# Patient Record
Sex: Female | Born: 1975 | Race: White | Hispanic: No | Marital: Single | Smoking: Former smoker
Health system: Southern US, Community
[De-identification: ages and names within clinical notes are randomized; demographics above are authoritative.]

## PROBLEM LIST (undated history)

## (undated) DIAGNOSIS — K561 Intussusception: Secondary | ICD-10-CM

## (undated) DIAGNOSIS — C801 Malignant (primary) neoplasm, unspecified: Secondary | ICD-10-CM

## (undated) DIAGNOSIS — K589 Irritable bowel syndrome without diarrhea: Secondary | ICD-10-CM

## (undated) DIAGNOSIS — J45909 Unspecified asthma, uncomplicated: Secondary | ICD-10-CM

## (undated) DIAGNOSIS — N809 Endometriosis, unspecified: Secondary | ICD-10-CM

## (undated) HISTORY — PX: TONSILLECTOMY: SUR1361

## (undated) HISTORY — PX: CARPAL TUNNEL RELEASE: SHX101

## (undated) HISTORY — PX: ABDOMINAL SURGERY: SHX537

---

## 2011-11-18 ENCOUNTER — Emergency Department (HOSPITAL_BASED_OUTPATIENT_CLINIC_OR_DEPARTMENT_OTHER)
Admission: EM | Admit: 2011-11-18 | Discharge: 2011-11-18 | Disposition: A | Discharge status: Home / Self Care | Payer: Self-pay | Attending: Emergency Medicine | Admitting: Emergency Medicine

## 2011-11-18 ENCOUNTER — Encounter (HOSPITAL_BASED_OUTPATIENT_CLINIC_OR_DEPARTMENT_OTHER): Payer: Self-pay | Admitting: Emergency Medicine

## 2011-11-18 DIAGNOSIS — R109 Unspecified abdominal pain: Secondary | ICD-10-CM | POA: Insufficient documentation

## 2011-11-18 DIAGNOSIS — F17200 Nicotine dependence, unspecified, uncomplicated: Secondary | ICD-10-CM | POA: Insufficient documentation

## 2011-11-18 DIAGNOSIS — K589 Irritable bowel syndrome without diarrhea: Secondary | ICD-10-CM | POA: Insufficient documentation

## 2011-11-18 DIAGNOSIS — Z859 Personal history of malignant neoplasm, unspecified: Secondary | ICD-10-CM | POA: Insufficient documentation

## 2011-11-18 HISTORY — DX: Irritable bowel syndrome, unspecified: K58.9

## 2011-11-18 HISTORY — DX: Malignant (primary) neoplasm, unspecified: C80.1

## 2011-11-18 LAB — CBC WITH DIFFERENTIAL/PLATELET
Basophils Relative: 0 % (ref 0–1)
Eosinophils Absolute: 0.1 10*3/uL (ref 0.0–0.7)
HCT: 37.1 % (ref 36.0–46.0)
Hemoglobin: 13.1 g/dL (ref 12.0–15.0)
Lymphs Abs: 3.2 10*3/uL (ref 0.7–4.0)
MCH: 29.3 pg (ref 26.0–34.0)
MCHC: 35.3 g/dL (ref 30.0–36.0)
MCV: 83 fL (ref 78.0–100.0)
Monocytes Absolute: 0.5 10*3/uL (ref 0.1–1.0)
Monocytes Relative: 5 % (ref 3–12)
RBC: 4.47 MIL/uL (ref 3.87–5.11)

## 2011-11-18 LAB — COMPREHENSIVE METABOLIC PANEL
Albumin: 4.5 g/dL (ref 3.5–5.2)
Alkaline Phosphatase: 82 U/L (ref 39–117)
BUN: 10 mg/dL (ref 6–23)
Creatinine, Ser: 0.7 mg/dL (ref 0.50–1.10)
GFR calc Af Amer: >90 mL/min (ref >90)
Glucose, Bld: 83 mg/dL (ref 70–99)
Potassium: 3.5 mEq/L (ref 3.5–5.1)
Total Bilirubin: 0.2 mg/dL — ABNORMAL LOW (ref 0.3–1.2)
Total Protein: 7.6 g/dL (ref 6.0–8.3)

## 2011-11-18 LAB — URINALYSIS, ROUTINE W REFLEX MICROSCOPIC
Glucose, UA: NEGATIVE mg/dL
Ketones, ur: NEGATIVE mg/dL
Leukocytes, UA: NEGATIVE
Nitrite: NEGATIVE
Protein, ur: NEGATIVE mg/dL
pH: 5.5 (ref 5.0–8.0)

## 2011-11-18 MED ORDER — SODIUM CHLORIDE 0.9 % IV BOLUS (SEPSIS)
1000.0000 mL | Freq: Once | INTRAVENOUS | Status: AC
  Administered 2011-11-18: 1000 mL via INTRAVENOUS

## 2011-11-18 NOTE — ED Notes (Signed)
Report right sided flank pain.  On Monday started running a fever but thought it was a virus. Dipped urine at work and it showed gross blood.

## 2011-11-18 NOTE — ED Notes (Signed)
Pt c/o of nausea but no vomiting. Denies difficulty or pain with urination.

## 2011-11-18 NOTE — ED Provider Notes (Signed)
History     CSN: [id number]  Arrival date & time 11/18/11  1528   First MD Initiated Contact with Patient 11/18/11 1553      Chief Complaint  Patient presents with  . Flank Pain     HPI  The patient presents with right flank pain.  Symptoms began insidiously 40 ago.  Since onset symptoms have been persistent, mildly uncomfortable with pressure sensation.  The sensation is nonradiating.  Given the onset of symptoms the patient also had a fever, this improved with OTC medication.  Today she denies any fever, nausea, vomiting, anorexia other abdominal pain.  She works at a health care facility, had a urine dipstick performed.  This demonstrated gross hematuria, and she was referred here for further evaluation. Patient notes that her last menstrual cycle was one week ago.   Past Medical History  Diagnosis Date  . Cancer   . IBS (irritable bowel syndrome)     Past Surgical History  Procedure Date  . Carpal tunnel release     No family history on file.  History  Substance Use Topics  . Smoking status: Current Every Day Smoker  . Smokeless tobacco: Not on file  . Alcohol Use: No    OB History    Grav Para Term Preterm Abortions TAB SAB Ect Mult Living                  Review of Systems  Constitutional:       HPI  HENT:       HPI otherwise negative  Eyes: Negative.   Respiratory:       HPI, otherwise negative  Cardiovascular:       HPI, otherwise nmegative  Gastrointestinal: Negative for vomiting.  Genitourinary:       HPI, otherwise negative  Musculoskeletal:       HPI, otherwise negative  Skin: Negative.   Neurological: Negative for weakness.    Allergies  Benadryl and Versed  Home Medications  No current outpatient prescriptions on file.  BP 131/78  Pulse 78  Temp 98.2 F (36.8 C) (Oral)  Resp 16  Ht 5\' 3"  (1.6 m)  Wt 155 lb (70.308 kg)  BMI 27.46 kg/m2  SpO2 100%  LMP 11/11/2011  Physical Exam  Nursing note and vitals  reviewed. Constitutional: She is oriented to person, place, and time. She appears well-developed and well-nourished. No distress.  HENT:  Head: Normocephalic and atraumatic.  Eyes: Conjunctivae normal and EOM are normal.  Cardiovascular: Normal rate and regular rhythm.   Pulmonary/Chest: Effort normal and breath sounds normal. No stridor. No respiratory distress.  Abdominal: She exhibits no distension.  Musculoskeletal: She exhibits no edema.  Neurological: She is alert and oriented to person, place, and time. No cranial nerve deficit.  Skin: Skin is warm and dry.  Psychiatric: She has a normal mood and affect.    ED Course  Procedures (including critical care time)  Labs Reviewed  COMPREHENSIVE METABOLIC PANEL - Abnormal; Notable for the following:    Total Bilirubin 0.2 (*)     All other components within normal limits  CBC WITH DIFFERENTIAL  URINALYSIS, ROUTINE W REFLEX MICROSCOPIC  URINE CULTURE   No results found.   1. Flank pain       MDM  This generally well-appearing female now presents with right flank pain.  On my exam she is in no distress.  Given the patient's description of a lateral pain with possible hematuria there is some suspicion of  nephrolithiasis, the patient has no history of this, and was in no distress on my exam.  The patient's labs were unremarkable.  A urine culture was sent.  Absent abnormal labs, with stable vital signs, and with no distress, the patient is appropriate for discharge with close outpatient followup.  She was advised on the need to return for any concerning changes, and to follow up with her primary care physician.    Gerhard Munch, MD 11/18/11 1718

## 2011-11-19 LAB — URINE CULTURE

## 2015-02-10 ENCOUNTER — Emergency Department (HOSPITAL_COMMUNITY): Payer: 59

## 2015-02-10 ENCOUNTER — Emergency Department (HOSPITAL_COMMUNITY)
Admission: EM | Admit: 2015-02-10 | Discharge: 2015-02-10 | Disposition: A | Discharge status: Home / Self Care | Payer: 59 | Attending: Emergency Medicine | Admitting: Emergency Medicine

## 2015-02-10 ENCOUNTER — Encounter (HOSPITAL_COMMUNITY): Payer: Self-pay | Admitting: Emergency Medicine

## 2015-02-10 DIAGNOSIS — Z3202 Encounter for pregnancy test, result negative: Secondary | ICD-10-CM | POA: Insufficient documentation

## 2015-02-10 DIAGNOSIS — Z8719 Personal history of other diseases of the digestive system: Secondary | ICD-10-CM | POA: Diagnosis not present

## 2015-02-10 DIAGNOSIS — Z8541 Personal history of malignant neoplasm of cervix uteri: Secondary | ICD-10-CM | POA: Diagnosis not present

## 2015-02-10 DIAGNOSIS — Z79899 Other long term (current) drug therapy: Secondary | ICD-10-CM | POA: Diagnosis not present

## 2015-02-10 DIAGNOSIS — J45909 Unspecified asthma, uncomplicated: Secondary | ICD-10-CM | POA: Diagnosis not present

## 2015-02-10 DIAGNOSIS — F17200 Nicotine dependence, unspecified, uncomplicated: Secondary | ICD-10-CM | POA: Diagnosis not present

## 2015-02-10 DIAGNOSIS — R1031 Right lower quadrant pain: Secondary | ICD-10-CM | POA: Insufficient documentation

## 2015-02-10 DIAGNOSIS — Z8742 Personal history of other diseases of the female genital tract: Secondary | ICD-10-CM | POA: Insufficient documentation

## 2015-02-10 HISTORY — DX: Endometriosis, unspecified: N80.9

## 2015-02-10 HISTORY — DX: Intussusception: K56.1

## 2015-02-10 HISTORY — DX: Unspecified asthma, uncomplicated: J45.909

## 2015-02-10 LAB — COMPREHENSIVE METABOLIC PANEL
ALBUMIN: 4.3 g/dL (ref 3.5–5.0)
ALK PHOS: 79 U/L (ref 38–126)
ALT: 31 U/L (ref 14–54)
ANION GAP: 11 (ref 5–15)
AST: 22 U/L (ref 15–41)
BILIRUBIN TOTAL: 0.4 mg/dL (ref 0.3–1.2)
BUN: 17 mg/dL (ref 6–20)
CALCIUM: 9 mg/dL (ref 8.9–10.3)
CO2: 23 mmol/L (ref 22–32)
Chloride: 104 mmol/L (ref 101–111)
Creatinine, Ser: 0.72 mg/dL (ref 0.44–1.00)
GFR calc non Af Amer: >60 mL/min (ref >60)
Glucose, Bld: 133 mg/dL — ABNORMAL HIGH (ref 65–99)
POTASSIUM: 3.9 mmol/L (ref 3.5–5.1)
SODIUM: 138 mmol/L (ref 135–145)
TOTAL PROTEIN: 7.4 g/dL (ref 6.5–8.1)

## 2015-02-10 LAB — CBC WITH DIFFERENTIAL/PLATELET
BASOS PCT: 0 %
Basophils Absolute: 0 10*3/uL (ref 0.0–0.1)
EOS ABS: 0.2 10*3/uL (ref 0.0–0.7)
Eosinophils Relative: 2 %
HEMATOCRIT: 40.2 % (ref 36.0–46.0)
HEMOGLOBIN: 13.4 g/dL (ref 12.0–15.0)
LYMPHS ABS: 3.7 10*3/uL (ref 0.7–4.0)
Lymphocytes Relative: 44 %
MCH: 28.8 pg (ref 26.0–34.0)
MCHC: 33.3 g/dL (ref 30.0–36.0)
MCV: 86.5 fL (ref 78.0–100.0)
MONO ABS: 0.5 10*3/uL (ref 0.1–1.0)
MONOS PCT: 6 %
NEUTROS PCT: 48 %
Neutro Abs: 4.1 10*3/uL (ref 1.7–7.7)
Platelets: 250 10*3/uL (ref 150–400)
RBC: 4.65 MIL/uL (ref 3.87–5.11)
RDW: 13.2 % (ref 11.5–15.5)
WBC: 8.5 10*3/uL (ref 4.0–10.5)

## 2015-02-10 LAB — URINALYSIS, ROUTINE W REFLEX MICROSCOPIC
Bilirubin Urine: NEGATIVE
Glucose, UA: NEGATIVE mg/dL
Hgb urine dipstick: NEGATIVE
KETONES UR: NEGATIVE mg/dL
LEUKOCYTES UA: NEGATIVE
NITRITE: NEGATIVE
PROTEIN: NEGATIVE mg/dL
Specific Gravity, Urine: 1.037 — ABNORMAL HIGH (ref 1.005–1.030)
pH: 5.5 (ref 5.0–8.0)

## 2015-02-10 LAB — PREGNANCY, URINE: PREG TEST UR: NEGATIVE

## 2015-02-10 MED ORDER — IOHEXOL 300 MG/ML  SOLN
25.0000 mL | Freq: Once | INTRAMUSCULAR | Status: AC | PRN
  Administered 2015-02-10: 25 mL via ORAL

## 2015-02-10 MED ORDER — IOHEXOL 300 MG/ML  SOLN
100.0000 mL | Freq: Once | INTRAMUSCULAR | Status: AC | PRN
  Administered 2015-02-10: 100 mL via INTRAVENOUS

## 2015-02-10 NOTE — Discharge Instructions (Signed)

## 2015-02-10 NOTE — ED Provider Notes (Signed)
Pt care assumed from [facility], PA-C at shift change pending CT results. In short, pt presenting with 2 days of intermittent RLQ abdominal pain. Pain is exacerbated with movement and palpation. Percocet provided some relief. No associated fever, chills, nausea, vomiting, diarrhea, vaginal discharge or dysuria. Pt with hx of IBS. On exam, tenderness in RLQ with mild voluntary guarding. No rigidity or rebound. Lab work and UA unremarkable. Patient's pain and other symptoms adequately managed in emergency department. Fluid bolus given   CT abd/pelv negative for acute disease processes. Chronic abnormal findings noted in RLQ when compared to CT in 2014.   Repeat abdominal exam before discharge without change. Patient discharged home with symptomatic treatment and given strict instructions for follow-up with their primary care physician.  I have also discussed reasons to return immediately to the ER.  Patient expresses understanding and agrees with plan.   Filed Vitals:   02/10/15 0356 02/10/15 0632 02/10/15 0633  BP: 139/88 121/53 113/70  Pulse: 72 73   Temp: 97.6 F (36.4 C) 97.5 F (36.4 C)   TempSrc: Oral Oral   Resp: 14 12   SpO2: 99% 98%      Results for orders placed or performed during the hospital encounter of 02/10/15 (from the past 24 hour(s))  CBC with Differential     Status: None   Collection Time: 02/10/15  4:04 AM  Result Value Ref Range   WBC 8.5 4.0 - 10.5 K/uL   RBC 4.65 3.87 - 5.11 MIL/uL   Hemoglobin 13.4 12.0 - 15.0 g/dL   HCT 40.2 36.0 - 46.0 %   MCV 86.5 78.0 - 100.0 fL   MCH 28.8 26.0 - 34.0 pg   MCHC 33.3 30.0 - 36.0 g/dL   RDW 13.2 11.5 - 15.5 %   Platelets 250 150 - 400 K/uL   Neutrophils Relative % 48 %   Neutro Abs 4.1 1.7 - 7.7 K/uL   Lymphocytes Relative 44 %   Lymphs Abs 3.7 0.7 - 4.0 K/uL   Monocytes Relative 6 %   Monocytes Absolute 0.5 0.1 - 1.0 K/uL   Eosinophils Relative 2 %   Eosinophils Absolute 0.2 0.0 - 0.7 K/uL   Basophils Relative 0 %    Basophils Absolute 0.0 0.0 - 0.1 K/uL  Comprehensive metabolic panel     Status: Abnormal   Collection Time: 02/10/15  4:04 AM  Result Value Ref Range   Sodium 138 135 - 145 mmol/L   Potassium 3.9 3.5 - 5.1 mmol/L   Chloride 104 101 - 111 mmol/L   CO2 23 22 - 32 mmol/L   Glucose, Bld 133 (H) 65 - 99 mg/dL   BUN 17 6 - 20 mg/dL   Creatinine, Ser 0.72 0.44 - 1.00 mg/dL   Calcium 9.0 8.9 - 10.3 mg/dL   Total Protein 7.4 6.5 - 8.1 g/dL   Albumin 4.3 3.5 - 5.0 g/dL   AST 22 15 - 41 U/L   ALT 31 14 - 54 U/L   Alkaline Phosphatase 79 38 - 126 U/L   Total Bilirubin 0.4 0.3 - 1.2 mg/dL   GFR calc non Af Amer >60 >60 mL/min   GFR calc Af Amer >60 >60 mL/min   Anion gap 11 5 - 15  Urinalysis, Routine w reflex microscopic (not at [facility])     Status: Abnormal   Collection Time: 02/10/15  4:25 AM  Result Value Ref Range   Color, Urine YELLOW YELLOW   APPearance CLOUDY (A) CLEAR  Specific Gravity, Urine 1.037 (H) 1.005 - 1.030   pH 5.5 5.0 - 8.0   Glucose, UA NEGATIVE NEGATIVE mg/dL   Hgb urine dipstick NEGATIVE NEGATIVE   Bilirubin Urine NEGATIVE NEGATIVE   Ketones, ur NEGATIVE NEGATIVE mg/dL   Protein, ur NEGATIVE NEGATIVE mg/dL   Nitrite NEGATIVE NEGATIVE   Leukocytes, UA NEGATIVE NEGATIVE  Pregnancy, urine     Status: None   Collection Time: 02/10/15  4:25 AM  Result Value Ref Range   Preg Test, Ur NEGATIVE NEGATIVE   CLINICAL DATA: Right lower quadrant pain over the last 8 hours, worsening. Normal white count.  EXAM: CT ABDOMEN AND PELVIS WITH CONTRAST  TECHNIQUE: Multidetector CT imaging of the abdomen and pelvis was performed using the standard protocol following bolus administration of intravenous contrast.  CONTRAST: 147mL OMNIPAQUE IOHEXOL 300 MG/ML SOLN  COMPARISON: CT 03/27/2012  FINDINGS: Lung bases are clear. No pleural or pericardial fluid. The liver shows mild fatty change. No focal lesion or detectable biliary ductal dilatation. The spleen is normal.  The pancreas is normal. The adrenal glands are normal. The kidneys are normal. The aorta and IVC are normal. No retroperitoneal mass or retroperitoneal lymphadenopathy. The uterus and adnexal regions are normal. The bladder appears normal.  There is no evidence of ileus or obstruction. No abnormality of the colon other than diverticulosis of the left colon. The appendix appears normal. The terminal ileum appears abnormal showing an abnormal dilated laminae with irregularity of the mucosa. This could be related to Crohn's disease as questioned previously. Lymph nodes in the right lower quadrant are actually less prominent than seen previously the index node measuring only 8 mm on today's study.  No significant bone finding.  IMPRESSION: No evidence of ileus, obstruction or free air. No appendicitis.  Abnormal findings again demonstrated in the right lower quadrant. Slightly prominent right lower quadrant mesenteric lymph nodes, though less than on the previous study. Continued abnormal appearance of the terminal ileum, which shows an irregular mucosal pattern with slight dilatation. This could go along with the diagnosis of Crohn's disease. No evidence of active local Complication.   Lahoma Crocker Kendell Sagraves, PA-C 02/10/15 [place], MD 02/11/15 [phone]

## 2015-02-10 NOTE — ED Notes (Signed)
Pt c/o RLQ pain onset 12/18 @ 2100. Worse with movement, denies N/V/D. Denies gyn s/s, denies urinary s/s

## 2015-02-10 NOTE — ED Provider Notes (Signed)
CSN: [id number]     Arrival date & time 02/10/15  0346 History   First MD Initiated Contact with Patient 02/10/15 0350     Chief Complaint  Patient presents with  . Abdominal Pain     (Consider location/radiation/quality/duration/timing/severity/associated sxs/prior Treatment) HPI Comments: 39 year old female presents to the emergency department for evaluation of abdominal pain. Symptoms began 2 days ago in the right lower quadrant. Pain has been waxing and waning in severity and intermittent, however, has been fairly constant over the past 24 hours. Pain worsens with movement. It is nonradiating. Patient took Percocet for symptoms this morning with some relief. She denies any nausea, vomiting, diarrhea, melena or hematochezia, vaginal bleeding, vaginal discharge, dysuria, or hematuria. No fever. Patient has a history of laparoscopic surgery 3 for endometriosis. She also had her gallbladder removed one year ago. Patient also with history of intussusception as well as IBS.  PCP - Dr. Jacqlyn Larsen  Patient is a 39 y.o. female presenting with abdominal pain. The history is provided by the patient. No language interpreter was used.  Abdominal Pain Associated symptoms: no dysuria, no fever, no hematuria, no nausea, no vaginal bleeding, no vaginal discharge and no vomiting     Past Medical History  Diagnosis Date  . IBS (irritable bowel syndrome)   . Cancer (HCC)     cervical   . Asthma   . Intussusception ([place])   . Endometriosis    Past Surgical History  Procedure Laterality Date  . Carpal tunnel release    . Tonsillectomy    . Abdominal surgery     No family history on file. Social History  Substance Use Topics  . Smoking status: Current Every Day Smoker  . Smokeless tobacco: None  . Alcohol Use: Yes     Comment: occ   OB History    No data available      Review of Systems  Constitutional: Negative for fever.  Gastrointestinal: Positive for abdominal pain. Negative for nausea  and vomiting.  Genitourinary: Negative for dysuria, hematuria, vaginal bleeding and vaginal discharge.  All other systems reviewed and are negative.   Allergies  Ambien; Benadryl; and Versed  Home Medications   Prior to Admission medications   Medication Sig Start Date End Date Taking? Authorizing Provider  albuterol (PROVENTIL HFA;VENTOLIN HFA) 108 (90 BASE) MCG/ACT inhaler Inhale 1-2 puffs into the lungs every 6 (six) hours as needed for wheezing or shortness of breath.   Yes Historical Provider, MD  JOLIVETTE 0.35 MG tablet Take 1 tablet by mouth daily. 01/09/15  Yes Historical Provider, MD   BP 113/70 mmHg  Pulse 73  Temp(Src) 97.5 F (36.4 C) (Oral)  Resp 12  SpO2 98%  LMP 02/02/2015   Physical Exam  Constitutional: She is oriented to person, place, and time. She appears well-developed and well-nourished. No distress.  Nontoxic/nonseptic appearing  HENT:  Head: Normocephalic and atraumatic.  Eyes: Conjunctivae and EOM are normal. No scleral icterus.  Neck: Normal range of motion.  Cardiovascular: Normal rate, regular rhythm and intact distal pulses.   Pulmonary/Chest: Effort normal. No respiratory distress.  Respirations even and unlabored  Abdominal: Soft. She exhibits no distension. There is tenderness. There is no rebound and no guarding.    Soft abdomen with focal TTP in the RLQ. Mild voluntary guarding. No peritoneal signs.  Musculoskeletal: Normal range of motion.  Neurological: She is alert and oriented to person, place, and time. She exhibits normal muscle tone. Coordination normal.  Skin: Skin is warm and  dry. No rash noted. She is not diaphoretic. No erythema. No pallor.  Psychiatric: She has a normal mood and affect. Her behavior is normal.  Nursing note and vitals reviewed.   ED Course  Procedures (including critical care time) Labs Review Labs Reviewed  COMPREHENSIVE METABOLIC PANEL - Abnormal; Notable for the following:    Glucose, Bld 133 (*)     All other components within normal limits  URINALYSIS, ROUTINE W REFLEX MICROSCOPIC (NOT AT [facility]) - Abnormal; Notable for the following:    APPearance CLOUDY (*)    Specific Gravity, Urine 1.037 (*)    All other components within normal limits  CBC WITH DIFFERENTIAL/PLATELET  PREGNANCY, URINE    Imaging Review No results found.   I have personally reviewed and evaluated these images and lab results as part of my medical decision-making.   EKG Interpretation None      MDM   Final diagnoses:  Right lower quadrant abdominal pain    39 year old female presents to the emergency department for evaluation of 2 days of right lower quadrant abdominal pain. She has no peritoneal signs on exam, but tenderness is appreciated at McBurney's point. Laboratory workup is reassuring. Patient has had no nausea, vomiting, or fever. Patient pending CT scan to evaluate for appendicitis. Patient signed out to [facility], PA-C at change of shift who will follow-up on imaging and disposition appropriately. Patient has declined pain medication while in the emergency department.   Filed Vitals:   02/10/15 0356 02/10/15 0632 02/10/15 0633  BP: 139/88 121/53 113/70  Pulse: 72 73   Temp: 97.6 F (36.4 C) 97.5 F (36.4 C)   TempSrc: Oral Oral   Resp: 14 12   SpO2: 99% 98%      Antonietta Breach, PA-C 02/11/15 [place], MD 02/11/15 [phone]

## 2015-02-10 NOTE — ED Notes (Signed)
Bed: WA13 Expected date:  Expected time:  Means of arrival:  Comments: 

## 2015-03-24 DIAGNOSIS — N63 Unspecified lump in breast: Secondary | ICD-10-CM | POA: Diagnosis not present

## 2015-03-24 DIAGNOSIS — Z01411 Encounter for gynecological examination (general) (routine) with abnormal findings: Secondary | ICD-10-CM | POA: Diagnosis not present

## 2015-03-24 DIAGNOSIS — N921 Excessive and frequent menstruation with irregular cycle: Secondary | ICD-10-CM | POA: Diagnosis not present

## 2015-03-24 DIAGNOSIS — Z124 Encounter for screening for malignant neoplasm of cervix: Secondary | ICD-10-CM | POA: Diagnosis not present

## 2015-03-24 DIAGNOSIS — Z1151 Encounter for screening for human papillomavirus (HPV): Secondary | ICD-10-CM | POA: Diagnosis not present

## 2015-03-24 DIAGNOSIS — R87610 Atypical squamous cells of undetermined significance on cytologic smear of cervix (ASC-US): Secondary | ICD-10-CM | POA: Diagnosis not present

## 2015-03-24 DIAGNOSIS — Z6833 Body mass index (BMI) 33.0-33.9, adult: Secondary | ICD-10-CM | POA: Diagnosis not present

## 2015-03-24 DIAGNOSIS — Z01419 Encounter for gynecological examination (general) (routine) without abnormal findings: Secondary | ICD-10-CM | POA: Diagnosis not present

## 2015-03-26 DIAGNOSIS — R928 Other abnormal and inconclusive findings on diagnostic imaging of breast: Secondary | ICD-10-CM | POA: Diagnosis not present

## 2015-03-26 DIAGNOSIS — N6489 Other specified disorders of breast: Secondary | ICD-10-CM | POA: Diagnosis not present

## 2015-03-26 DIAGNOSIS — N63 Unspecified lump in breast: Secondary | ICD-10-CM | POA: Diagnosis not present

## 2015-04-08 DIAGNOSIS — N926 Irregular menstruation, unspecified: Secondary | ICD-10-CM | POA: Diagnosis not present

## 2015-04-08 DIAGNOSIS — N921 Excessive and frequent menstruation with irregular cycle: Secondary | ICD-10-CM | POA: Diagnosis not present

## 2015-04-08 DIAGNOSIS — Z6833 Body mass index (BMI) 33.0-33.9, adult: Secondary | ICD-10-CM | POA: Diagnosis not present

## 2015-05-19 DIAGNOSIS — Z6833 Body mass index (BMI) 33.0-33.9, adult: Secondary | ICD-10-CM | POA: Diagnosis not present

## 2015-05-19 DIAGNOSIS — N921 Excessive and frequent menstruation with irregular cycle: Secondary | ICD-10-CM | POA: Diagnosis not present

## 2015-05-19 DIAGNOSIS — N83202 Unspecified ovarian cyst, left side: Secondary | ICD-10-CM | POA: Diagnosis not present

## 2015-08-04 DIAGNOSIS — N840 Polyp of corpus uteri: Secondary | ICD-10-CM | POA: Diagnosis not present

## 2015-08-04 DIAGNOSIS — Z6833 Body mass index (BMI) 33.0-33.9, adult: Secondary | ICD-10-CM | POA: Diagnosis not present

## 2015-08-04 DIAGNOSIS — Z01812 Encounter for preprocedural laboratory examination: Secondary | ICD-10-CM | POA: Diagnosis not present

## 2015-08-14 DIAGNOSIS — R87612 Low grade squamous intraepithelial lesion on cytologic smear of cervix (LGSIL): Secondary | ICD-10-CM | POA: Diagnosis not present

## 2015-08-14 DIAGNOSIS — N840 Polyp of corpus uteri: Secondary | ICD-10-CM | POA: Diagnosis not present

## 2015-08-14 DIAGNOSIS — N841 Polyp of cervix uteri: Secondary | ICD-10-CM | POA: Diagnosis not present

## 2015-08-14 DIAGNOSIS — N920 Excessive and frequent menstruation with regular cycle: Secondary | ICD-10-CM | POA: Diagnosis not present

## 2015-08-19 DIAGNOSIS — H52223 Regular astigmatism, bilateral: Secondary | ICD-10-CM | POA: Diagnosis not present

## 2015-09-11 DIAGNOSIS — R87613 High grade squamous intraepithelial lesion on cytologic smear of cervix (HGSIL): Secondary | ICD-10-CM | POA: Diagnosis not present

## 2015-09-11 DIAGNOSIS — N72 Inflammatory disease of cervix uteri: Secondary | ICD-10-CM | POA: Diagnosis not present

## 2015-09-11 DIAGNOSIS — Z01812 Encounter for preprocedural laboratory examination: Secondary | ICD-10-CM | POA: Diagnosis not present

## 2015-09-11 DIAGNOSIS — Z3202 Encounter for pregnancy test, result negative: Secondary | ICD-10-CM | POA: Diagnosis not present

## 2015-11-05 DIAGNOSIS — R87613 High grade squamous intraepithelial lesion on cytologic smear of cervix (HGSIL): Secondary | ICD-10-CM | POA: Diagnosis not present

## 2015-11-25 DIAGNOSIS — R87613 High grade squamous intraepithelial lesion on cytologic smear of cervix (HGSIL): Secondary | ICD-10-CM | POA: Diagnosis not present

## 2015-11-26 DIAGNOSIS — Z0000 Encounter for general adult medical examination without abnormal findings: Secondary | ICD-10-CM | POA: Diagnosis not present

## 2015-12-01 DIAGNOSIS — Z885 Allergy status to narcotic agent status: Secondary | ICD-10-CM | POA: Diagnosis not present

## 2015-12-01 DIAGNOSIS — D060 Carcinoma in situ of endocervix: Secondary | ICD-10-CM | POA: Diagnosis not present

## 2015-12-01 DIAGNOSIS — D069 Carcinoma in situ of cervix, unspecified: Secondary | ICD-10-CM | POA: Diagnosis not present

## 2015-12-01 DIAGNOSIS — F17210 Nicotine dependence, cigarettes, uncomplicated: Secondary | ICD-10-CM | POA: Diagnosis not present

## 2015-12-01 DIAGNOSIS — N72 Inflammatory disease of cervix uteri: Secondary | ICD-10-CM | POA: Diagnosis not present

## 2015-12-02 DIAGNOSIS — N72 Inflammatory disease of cervix uteri: Secondary | ICD-10-CM | POA: Diagnosis not present

## 2015-12-02 DIAGNOSIS — D060 Carcinoma in situ of endocervix: Secondary | ICD-10-CM | POA: Diagnosis not present

## 2015-12-02 DIAGNOSIS — Z885 Allergy status to narcotic agent status: Secondary | ICD-10-CM | POA: Diagnosis not present

## 2015-12-02 DIAGNOSIS — F17210 Nicotine dependence, cigarettes, uncomplicated: Secondary | ICD-10-CM | POA: Diagnosis not present

## 2015-12-10 ENCOUNTER — Other Ambulatory Visit (HOSPITAL_COMMUNITY): Payer: Self-pay | Admitting: Internal Medicine

## 2015-12-10 DIAGNOSIS — C549 Malignant neoplasm of corpus uteri, unspecified: Secondary | ICD-10-CM

## 2015-12-10 DIAGNOSIS — Z23 Encounter for immunization: Secondary | ICD-10-CM | POA: Diagnosis not present

## 2015-12-10 DIAGNOSIS — R740 Nonspecific elevation of levels of transaminase and lactic acid dehydrogenase [LDH]: Secondary | ICD-10-CM

## 2015-12-10 DIAGNOSIS — E6609 Other obesity due to excess calories: Secondary | ICD-10-CM | POA: Diagnosis not present

## 2015-12-10 DIAGNOSIS — C539 Malignant neoplasm of cervix uteri, unspecified: Secondary | ICD-10-CM | POA: Diagnosis not present

## 2015-12-10 DIAGNOSIS — Z0000 Encounter for general adult medical examination without abnormal findings: Secondary | ICD-10-CM | POA: Diagnosis not present

## 2015-12-10 DIAGNOSIS — R7401 Elevation of levels of liver transaminase levels: Secondary | ICD-10-CM

## 2015-12-12 ENCOUNTER — Ambulatory Visit (HOSPITAL_COMMUNITY)
Admission: RE | Admit: 2015-12-12 | Discharge: 2015-12-12 | Disposition: A | Discharge status: Home / Self Care | Payer: 59 | Source: Ambulatory Visit | Attending: Internal Medicine | Admitting: Internal Medicine

## 2015-12-12 DIAGNOSIS — R740 Nonspecific elevation of levels of transaminase and lactic acid dehydrogenase [LDH]: Secondary | ICD-10-CM | POA: Diagnosis not present

## 2015-12-12 DIAGNOSIS — C539 Malignant neoplasm of cervix uteri, unspecified: Secondary | ICD-10-CM | POA: Insufficient documentation

## 2015-12-12 DIAGNOSIS — C55 Malignant neoplasm of uterus, part unspecified: Secondary | ICD-10-CM | POA: Diagnosis not present

## 2015-12-12 DIAGNOSIS — K769 Liver disease, unspecified: Secondary | ICD-10-CM | POA: Diagnosis not present

## 2015-12-12 DIAGNOSIS — Z9049 Acquired absence of other specified parts of digestive tract: Secondary | ICD-10-CM | POA: Diagnosis not present

## 2015-12-12 DIAGNOSIS — C549 Malignant neoplasm of corpus uteri, unspecified: Secondary | ICD-10-CM

## 2015-12-12 DIAGNOSIS — R7401 Elevation of levels of liver transaminase levels: Secondary | ICD-10-CM

## 2016-01-05 DIAGNOSIS — R7989 Other specified abnormal findings of blood chemistry: Secondary | ICD-10-CM | POA: Diagnosis not present

## 2016-01-05 DIAGNOSIS — Z0000 Encounter for general adult medical examination without abnormal findings: Secondary | ICD-10-CM | POA: Diagnosis not present

## 2016-04-19 DIAGNOSIS — Z01419 Encounter for gynecological examination (general) (routine) without abnormal findings: Secondary | ICD-10-CM | POA: Diagnosis not present

## 2016-04-19 DIAGNOSIS — R87612 Low grade squamous intraepithelial lesion on cytologic smear of cervix (LGSIL): Secondary | ICD-10-CM | POA: Diagnosis not present

## 2016-04-19 DIAGNOSIS — Z01411 Encounter for gynecological examination (general) (routine) with abnormal findings: Secondary | ICD-10-CM | POA: Diagnosis not present

## 2016-04-19 DIAGNOSIS — Z1272 Encounter for screening for malignant neoplasm of vagina: Secondary | ICD-10-CM | POA: Diagnosis not present

## 2016-04-19 DIAGNOSIS — Z6833 Body mass index (BMI) 33.0-33.9, adult: Secondary | ICD-10-CM | POA: Diagnosis not present

## 2016-04-19 DIAGNOSIS — Z1151 Encounter for screening for human papillomavirus (HPV): Secondary | ICD-10-CM | POA: Diagnosis not present

## 2016-05-11 DIAGNOSIS — Z01812 Encounter for preprocedural laboratory examination: Secondary | ICD-10-CM | POA: Diagnosis not present

## 2016-05-11 DIAGNOSIS — R87612 Low grade squamous intraepithelial lesion on cytologic smear of cervix (LGSIL): Secondary | ICD-10-CM | POA: Diagnosis not present

## 2016-05-11 DIAGNOSIS — Z6833 Body mass index (BMI) 33.0-33.9, adult: Secondary | ICD-10-CM | POA: Diagnosis not present

## 2016-06-24 IMAGING — CT CT ABD-PELV W/ CM
2 of 4 series · 16 of 46 positions shown, 18 images · IV contrast (100 ML OMNI 300)
Comparison: CT 03/27/2012

CLINICAL DATA: Right lower quadrant pain over the last 8 hours,
worsening. Normal white count.

EXAM:
CT ABDOMEN AND PELVIS WITH CONTRAST
TECHNIQUE: Multidetector CT imaging of the abdomen and pelvis was performed
using the standard protocol following bolus administration of
intravenous contrast.
CONTRAST:  100mL OMNIPAQUE IOHEXOL 300 MG/ML  SOLN

[Series 2: abd/pel with · axial · 0.73mm/px · z∈[-243,+147]mm · 13 of 86 slices shown, 15 images]
[im 4/86  soft-tissue]
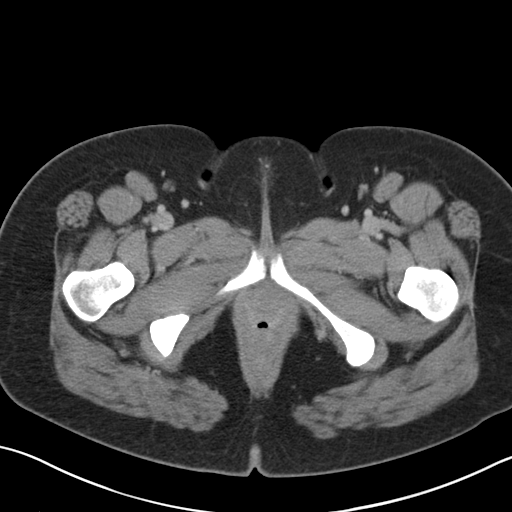
[im 4/86  bone]
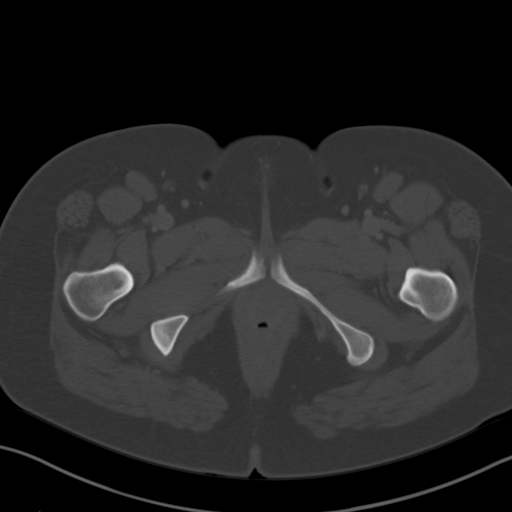
[im 11/86  soft-tissue]
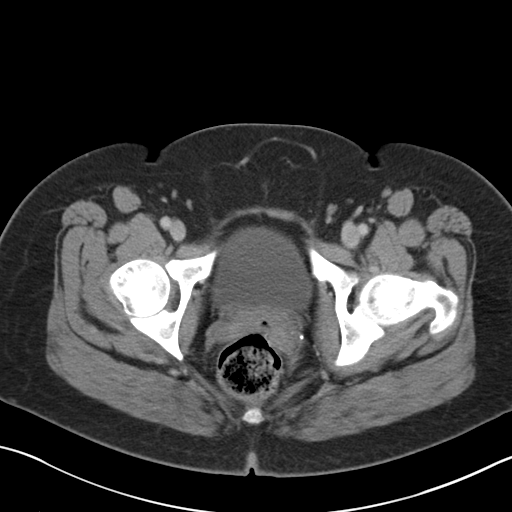
[im 18/86  soft-tissue]
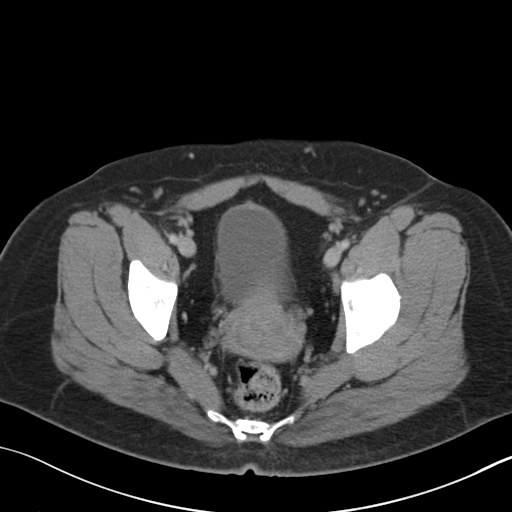
[im 24/86  soft-tissue]
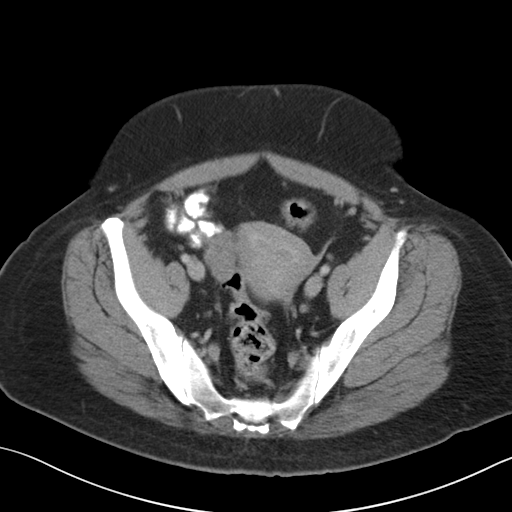
[im 31/86  soft-tissue]
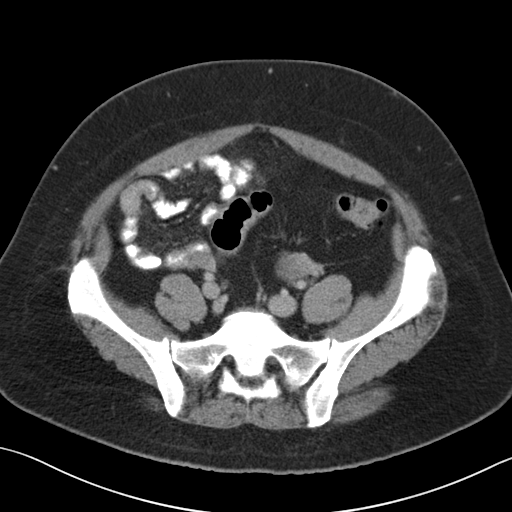
[im 38/86  soft-tissue]
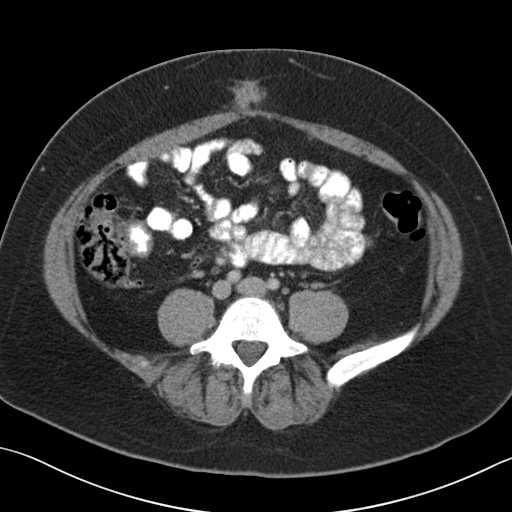
[im 45/86  soft-tissue]
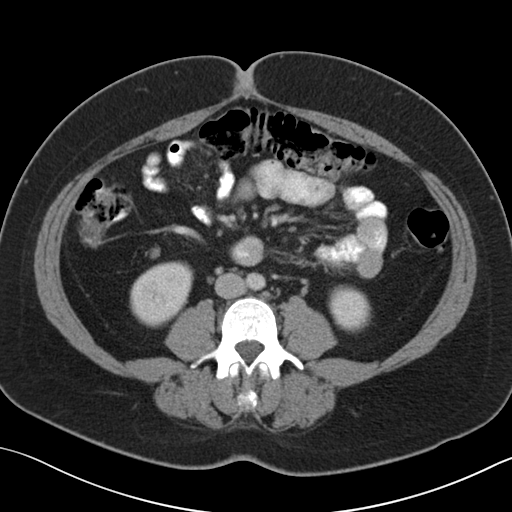
[im 48/86  soft-tissue]
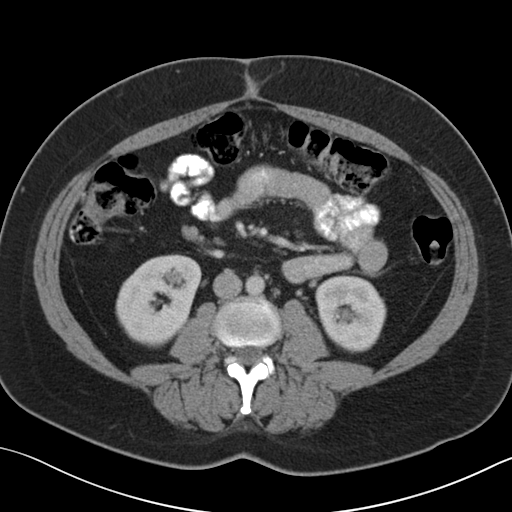
[im 55/86  soft-tissue]
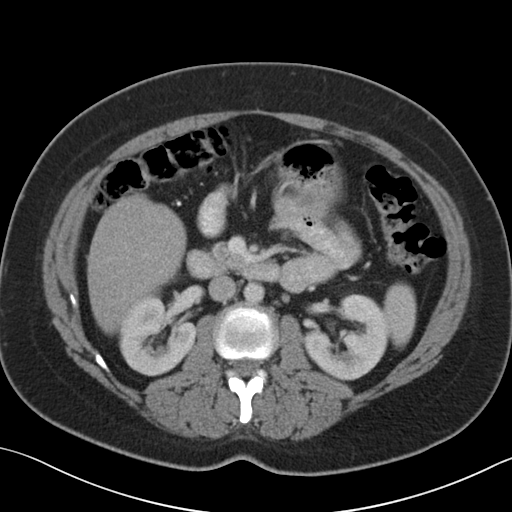
[im 55/86  bone]
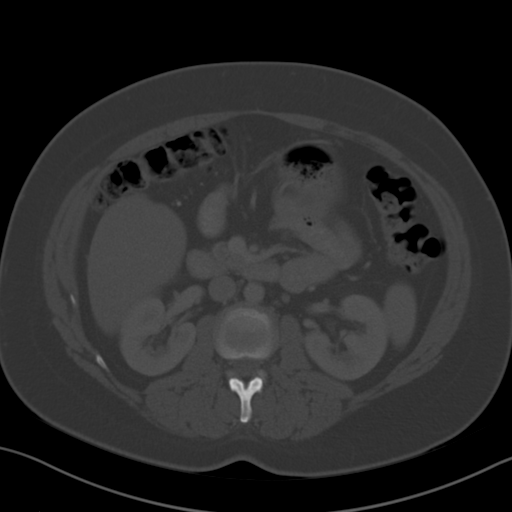
[im 62/86  soft-tissue]
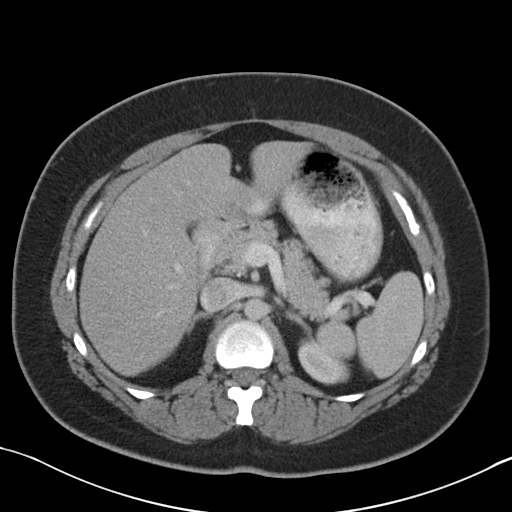
[im 69/86  soft-tissue]
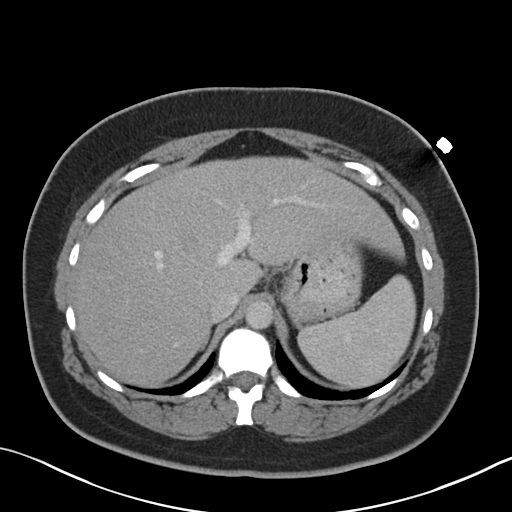
[im 75/86  soft-tissue]
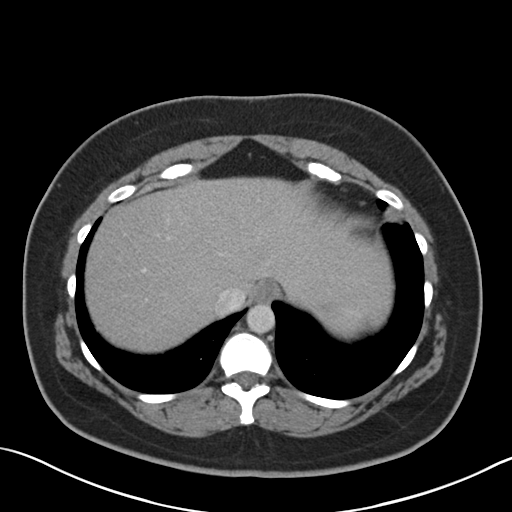
[im 82/86  soft-tissue]
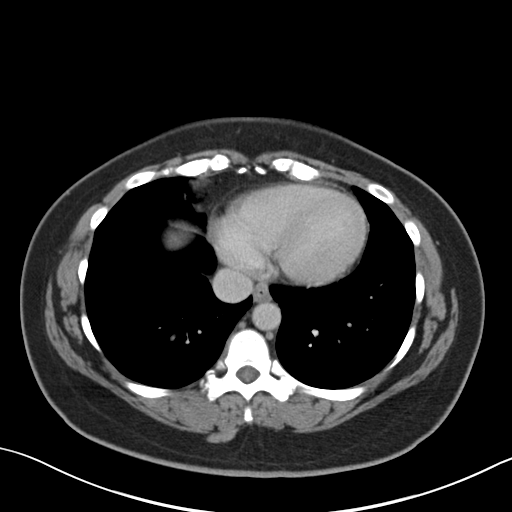

[Series 4: coronal a/|p · coronal · 0.70mm/px · 3 of 94 slices shown]
[im 32/94  soft-tissue]
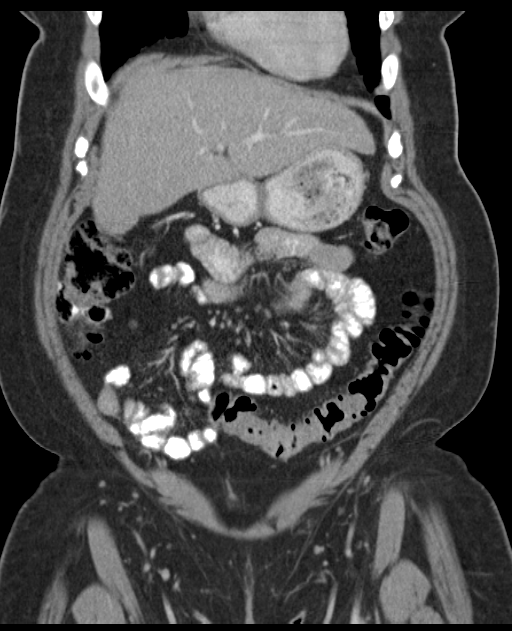
[im 42/94  soft-tissue]
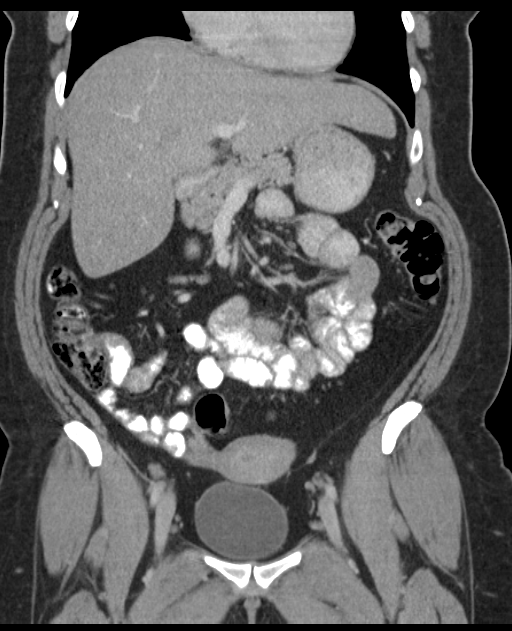
[im 52/94  soft-tissue]
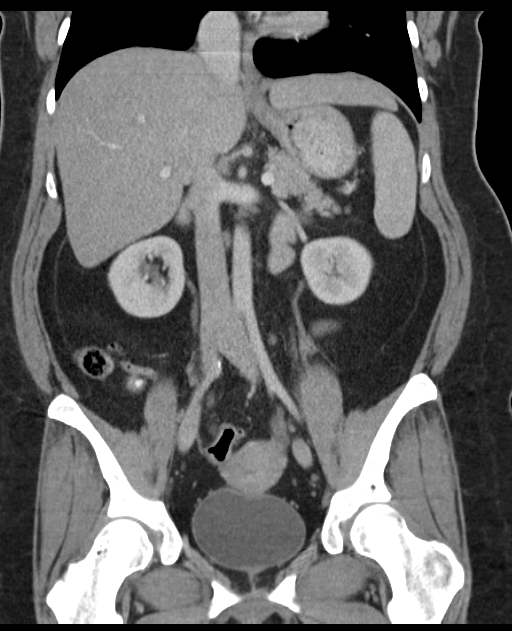

[16 of 46 positions shown; findings below may reference images not displayed]

FINDINGS: Lung bases are clear. No pleural or pericardial fluid. The liver
shows mild fatty change. No focal lesion or detectable biliary
ductal dilatation. The spleen is normal. The pancreas is normal. The
adrenal glands are normal. The kidneys are normal. The aorta and IVC
are normal. No retroperitoneal mass or retroperitoneal
lymphadenopathy. The uterus and adnexal regions are normal. The
bladder appears normal.

There is no evidence of ileus or obstruction. No abnormality of the
colon other than diverticulosis of the left colon. The appendix
appears normal. The terminal ileum appears abnormal showing an
abnormal dilated laminae with irregularity of the mucosa. This could
be related to Crohn's disease as questioned previously. Lymph nodes
in the right lower quadrant are actually less prominent than seen
previously the index node measuring only 8 mm on today's study.

No significant bone finding.
IMPRESSION: No evidence of ileus, obstruction or free air.  No appendicitis.

Abnormal findings again demonstrated in the right lower quadrant.
Slightly prominent right lower quadrant mesenteric lymph nodes,
though less than on the previous study. Continued abnormal
appearance of the terminal ileum, which shows an irregular mucosal
pattern with slight dilatation. This could go along with the
diagnosis of Crohn's disease. No evidence of active local
complication.

## 2016-06-30 DIAGNOSIS — R7989 Other specified abnormal findings of blood chemistry: Secondary | ICD-10-CM | POA: Diagnosis not present

## 2016-07-05 DIAGNOSIS — K7689 Other specified diseases of liver: Secondary | ICD-10-CM | POA: Diagnosis not present

## 2016-07-20 DIAGNOSIS — K1120 Sialoadenitis, unspecified: Secondary | ICD-10-CM | POA: Diagnosis not present

## 2016-07-20 DIAGNOSIS — K116 Mucocele of salivary gland: Secondary | ICD-10-CM | POA: Diagnosis not present

## 2016-10-04 DIAGNOSIS — K7689 Other specified diseases of liver: Secondary | ICD-10-CM | POA: Diagnosis not present

## 2016-10-11 DIAGNOSIS — Z0000 Encounter for general adult medical examination without abnormal findings: Secondary | ICD-10-CM | POA: Diagnosis not present

## 2016-10-11 DIAGNOSIS — E559 Vitamin D deficiency, unspecified: Secondary | ICD-10-CM | POA: Diagnosis not present

## 2016-10-11 DIAGNOSIS — K7689 Other specified diseases of liver: Secondary | ICD-10-CM | POA: Diagnosis not present

## 2016-10-28 DIAGNOSIS — H04123 Dry eye syndrome of bilateral lacrimal glands: Secondary | ICD-10-CM | POA: Diagnosis not present

## 2016-12-17 DIAGNOSIS — Z01411 Encounter for gynecological examination (general) (routine) with abnormal findings: Secondary | ICD-10-CM | POA: Diagnosis not present

## 2016-12-17 DIAGNOSIS — R87610 Atypical squamous cells of undetermined significance on cytologic smear of cervix (ASC-US): Secondary | ICD-10-CM | POA: Diagnosis not present

## 2016-12-17 DIAGNOSIS — R87622 Low grade squamous intraepithelial lesion on cytologic smear of vagina (LGSIL): Secondary | ICD-10-CM | POA: Diagnosis not present

## 2016-12-26 IMAGING — US US ABDOMEN COMPLETE
1 series · 14 of 25 positions shown · non-contrast
Comparison: CT scan of February 10, 2015. Ultrasound March 27, 2012.

CLINICAL DATA: Malignant neoplasm of body of uterus. Elevated ALT
level.

EXAM:
ABDOMEN ULTRASOUND COMPLETE

[Series 1: us abdomen complete · 0.24mm/px · 14 of 71 slices shown]
[im 1/71]
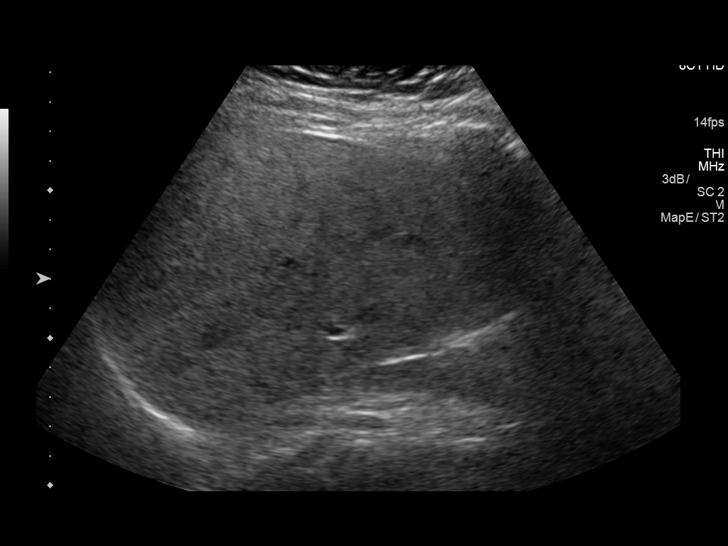
[im 6/71]
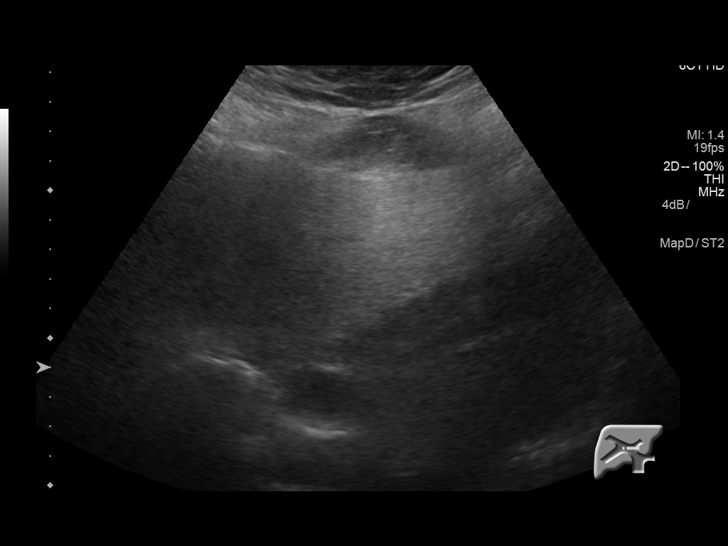
[im 12/71]
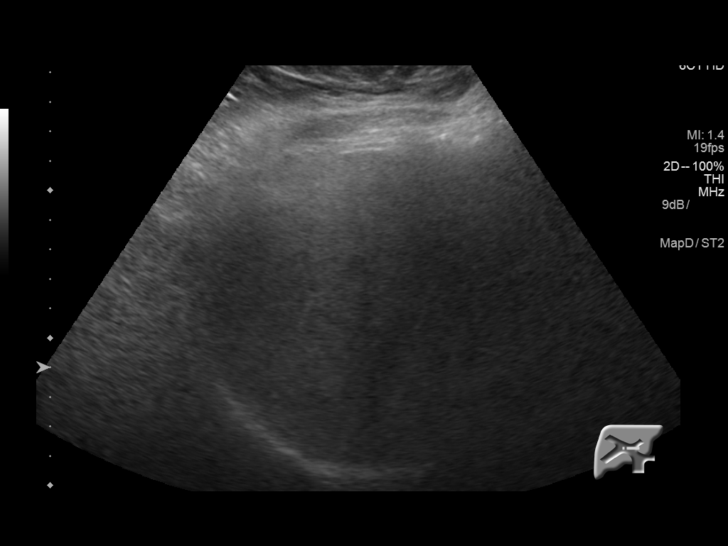
[im 18/71]
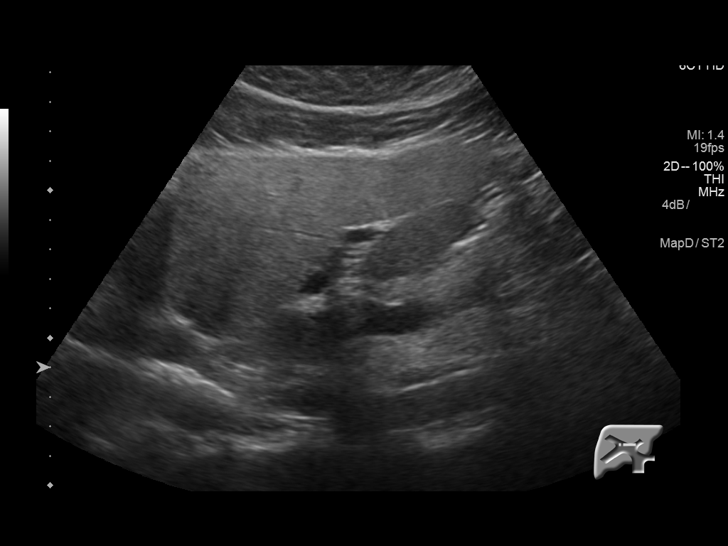
[im 24/71]
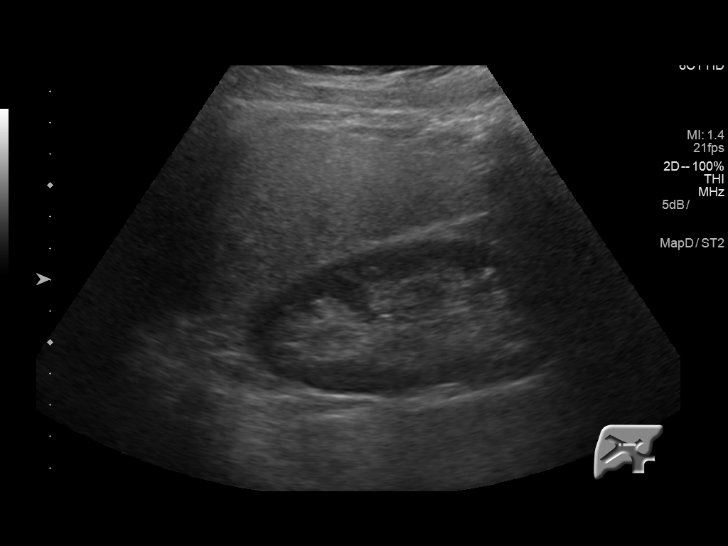
[im 27/71]
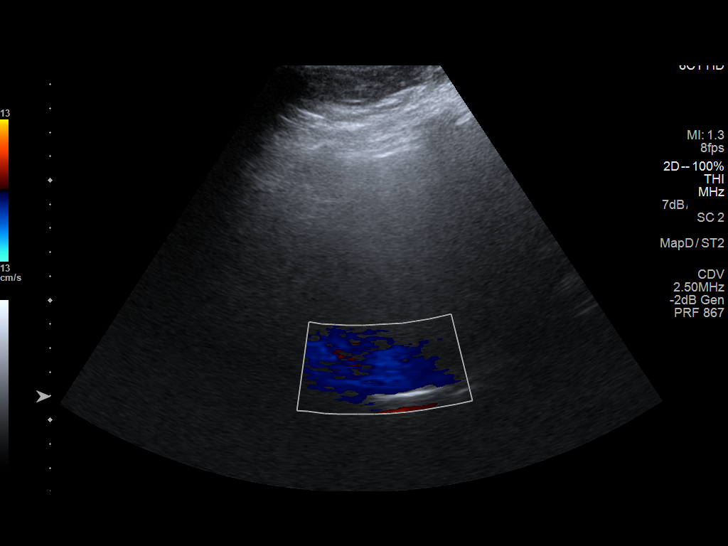
[im 33/71]
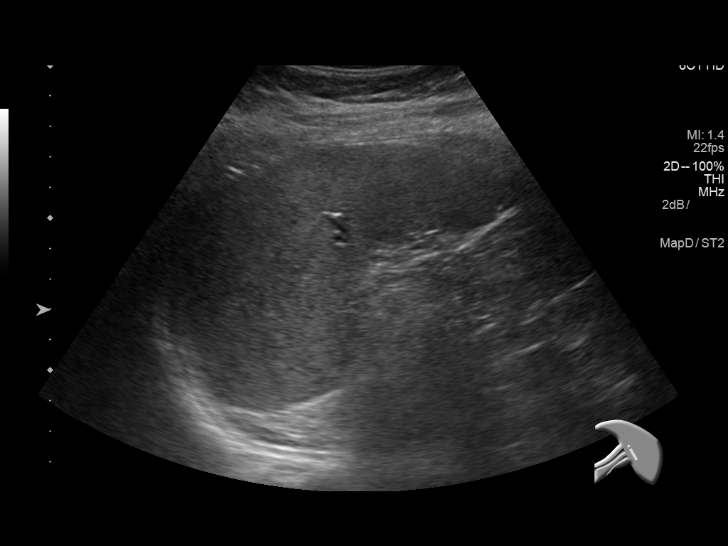
[im 38/71]
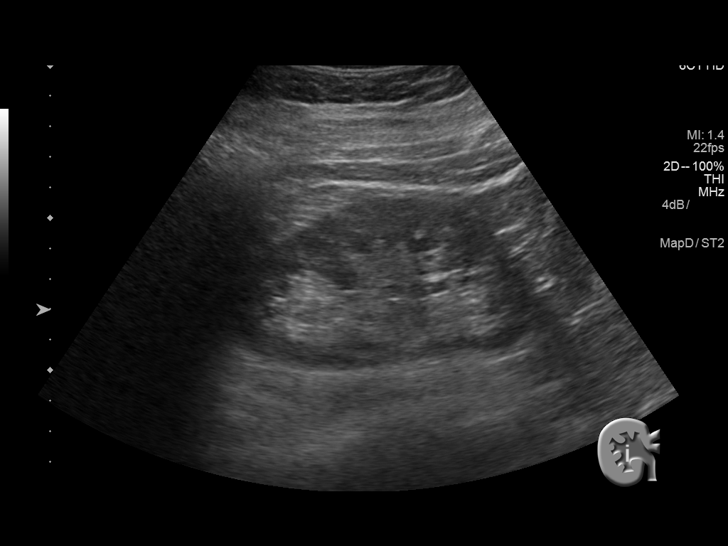
[im 44/71]
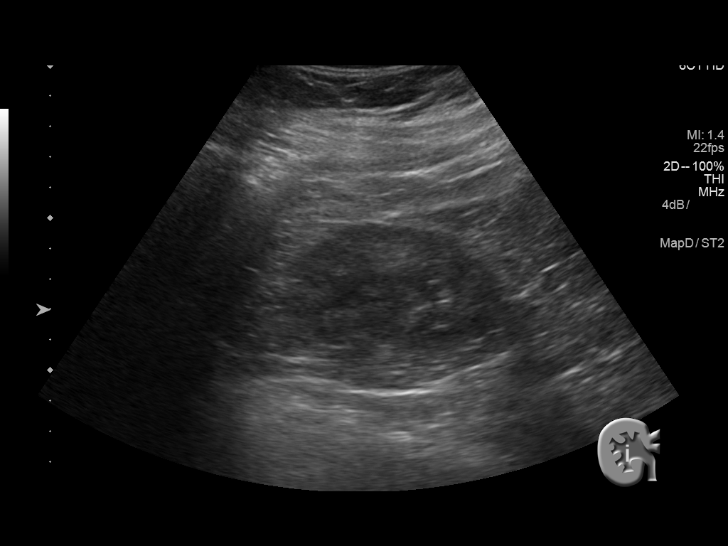
[im 47/71]
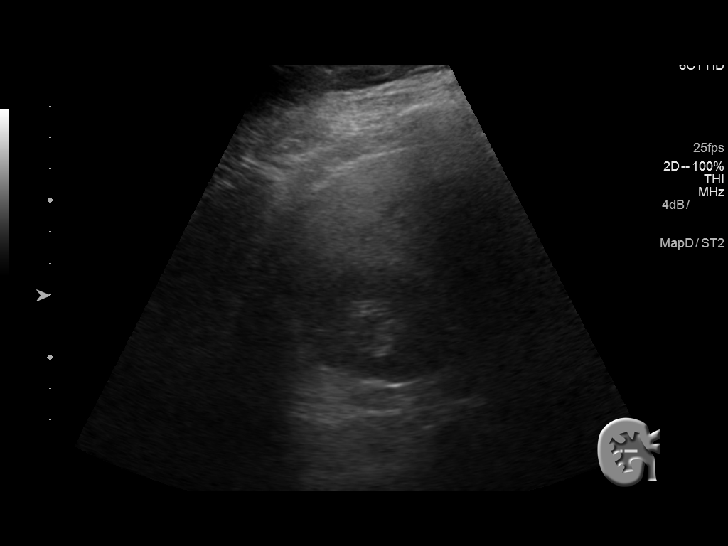
[im 53/71]
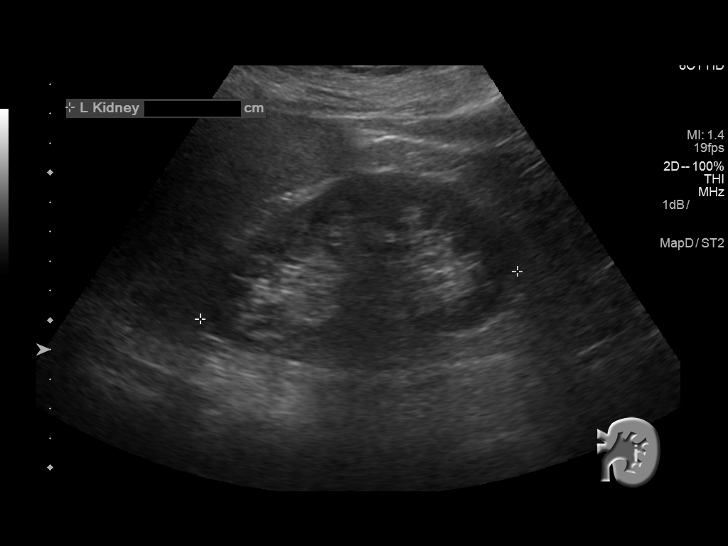
[im 59/71]
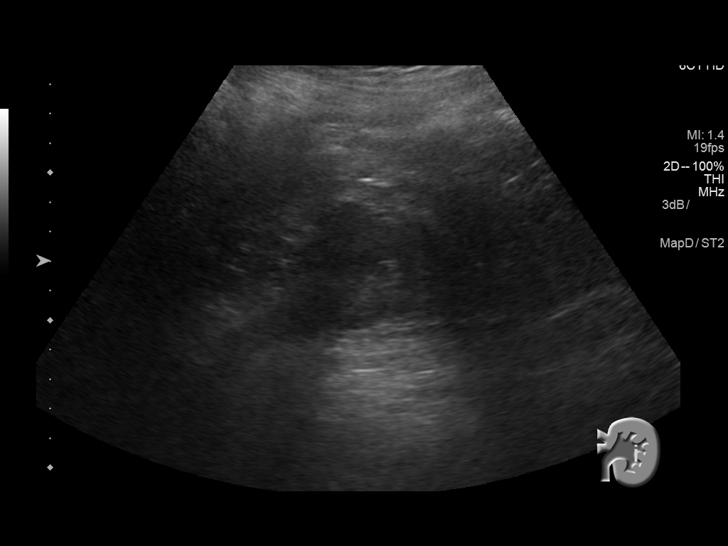
[im 65/71]
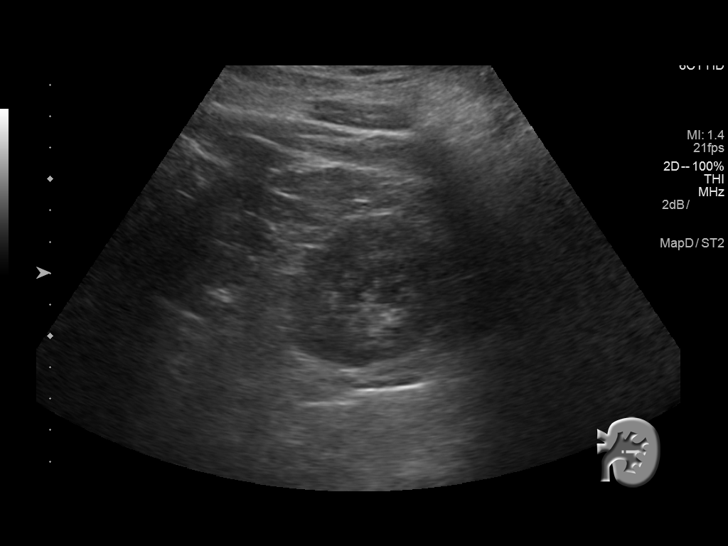
[im 71/71]
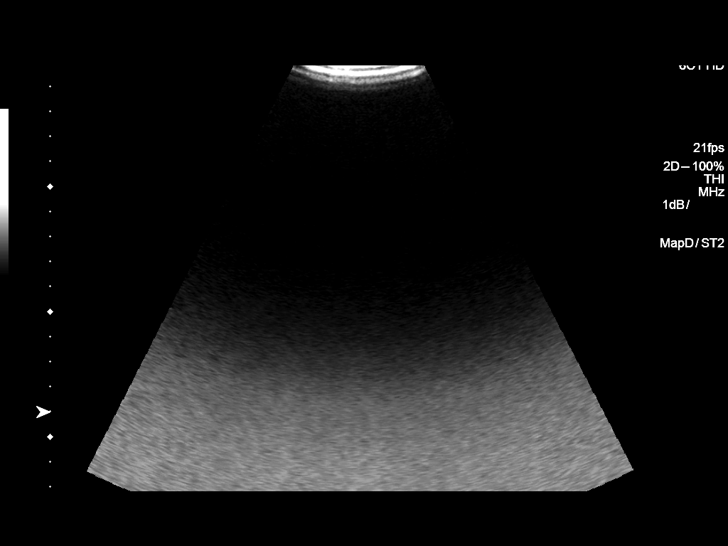

[14 of 25 positions shown; findings below may reference images not displayed]

FINDINGS: Gallbladder: Status post cholecystectomy.

Common bile duct: Diameter: 4.1 mm which is within normal limits.

Liver: No focal lesion identified. Increased echogenicity of hepatic
parenchyma is noted suggesting fatty infiltration.

IVC: Not well visualized.

Pancreas: Visualized portion unremarkable.

Spleen: Size and appearance within normal limits.

Right Kidney: Length: 10.5 cm. Echogenicity within normal limits. No
mass or hydronephrosis visualized.

Left Kidney: Length: 10.9 sent. Echogenicity within normal limits.
No mass or hydronephrosis visualized.

Abdominal aorta: No aneurysm visualized.

Other findings: None.
IMPRESSION: Status post cholecystectomy. Increased echogenicity of hepatic
parenchyma is noted suggesting fatty infiltration. No other
abnormality seen in the abdomen.

## 2017-01-24 MED FILL — AZITHROMYCIN 250 MG TAB: 250 | 5 days supply | Qty: 6 | Fill #0

## 2017-04-11 DIAGNOSIS — E559 Vitamin D deficiency, unspecified: Secondary | ICD-10-CM | POA: Diagnosis not present

## 2017-04-11 DIAGNOSIS — Z0000 Encounter for general adult medical examination without abnormal findings: Secondary | ICD-10-CM | POA: Diagnosis not present

## 2017-04-11 DIAGNOSIS — K7689 Other specified diseases of liver: Secondary | ICD-10-CM | POA: Diagnosis not present

## 2017-04-18 DIAGNOSIS — E559 Vitamin D deficiency, unspecified: Secondary | ICD-10-CM | POA: Diagnosis not present

## 2017-04-18 DIAGNOSIS — E781 Pure hyperglyceridemia: Secondary | ICD-10-CM | POA: Diagnosis not present

## 2017-04-18 DIAGNOSIS — Z0000 Encounter for general adult medical examination without abnormal findings: Secondary | ICD-10-CM | POA: Diagnosis not present

## 2017-05-24 DIAGNOSIS — Z90711 Acquired absence of uterus with remaining cervical stump: Secondary | ICD-10-CM | POA: Diagnosis not present

## 2017-05-24 DIAGNOSIS — R87811 Vaginal high risk human papillomavirus (HPV) DNA test positive: Secondary | ICD-10-CM | POA: Diagnosis not present

## 2017-05-24 DIAGNOSIS — R1032 Left lower quadrant pain: Secondary | ICD-10-CM | POA: Diagnosis not present

## 2017-05-24 DIAGNOSIS — R87610 Atypical squamous cells of undetermined significance on cytologic smear of cervix (ASC-US): Secondary | ICD-10-CM | POA: Diagnosis not present

## 2017-05-24 DIAGNOSIS — Z01419 Encounter for gynecological examination (general) (routine) without abnormal findings: Secondary | ICD-10-CM | POA: Diagnosis not present

## 2017-05-24 DIAGNOSIS — Z01411 Encounter for gynecological examination (general) (routine) with abnormal findings: Secondary | ICD-10-CM | POA: Diagnosis not present

## 2017-05-24 DIAGNOSIS — R87620 Atypical squamous cells of undetermined significance on cytologic smear of vagina (ASC-US): Secondary | ICD-10-CM | POA: Diagnosis not present

## 2017-06-30 DIAGNOSIS — E669 Obesity, unspecified: Secondary | ICD-10-CM | POA: Diagnosis not present

## 2017-06-30 DIAGNOSIS — R87622 Low grade squamous intraepithelial lesion on cytologic smear of vagina (LGSIL): Secondary | ICD-10-CM | POA: Diagnosis not present

## 2017-10-28 DIAGNOSIS — H52223 Regular astigmatism, bilateral: Secondary | ICD-10-CM | POA: Diagnosis not present

## 2017-10-28 DIAGNOSIS — H5213 Myopia, bilateral: Secondary | ICD-10-CM | POA: Diagnosis not present

## 2018-02-16 DIAGNOSIS — Z01419 Encounter for gynecological examination (general) (routine) without abnormal findings: Secondary | ICD-10-CM | POA: Diagnosis not present

## 2018-02-16 DIAGNOSIS — R87620 Atypical squamous cells of undetermined significance on cytologic smear of vagina (ASC-US): Secondary | ICD-10-CM | POA: Diagnosis not present

## 2018-02-16 DIAGNOSIS — R87810 Cervical high risk human papillomavirus (HPV) DNA test positive: Secondary | ICD-10-CM | POA: Diagnosis not present

## 2018-02-16 DIAGNOSIS — R87610 Atypical squamous cells of undetermined significance on cytologic smear of cervix (ASC-US): Secondary | ICD-10-CM | POA: Diagnosis not present

## 2018-02-16 DIAGNOSIS — R87811 Vaginal high risk human papillomavirus (HPV) DNA test positive: Secondary | ICD-10-CM | POA: Diagnosis not present

## 2018-04-11 DIAGNOSIS — L570 Actinic keratosis: Secondary | ICD-10-CM | POA: Diagnosis not present

## 2018-04-11 DIAGNOSIS — D225 Melanocytic nevi of trunk: Secondary | ICD-10-CM | POA: Diagnosis not present

## 2018-04-11 DIAGNOSIS — L814 Other melanin hyperpigmentation: Secondary | ICD-10-CM | POA: Diagnosis not present

## 2018-04-24 DIAGNOSIS — E781 Pure hyperglyceridemia: Secondary | ICD-10-CM | POA: Diagnosis not present

## 2018-04-24 DIAGNOSIS — G4726 Circadian rhythm sleep disorder, shift work type: Secondary | ICD-10-CM | POA: Diagnosis not present

## 2018-04-24 DIAGNOSIS — Z0000 Encounter for general adult medical examination without abnormal findings: Secondary | ICD-10-CM | POA: Diagnosis not present

## 2018-04-24 DIAGNOSIS — E559 Vitamin D deficiency, unspecified: Secondary | ICD-10-CM | POA: Diagnosis not present

## 2018-04-27 MED FILL — ARMODAFINIL 150 MG TABLET: 150 | 30 days supply | Qty: 30 | Fill #0

## 2018-04-28 DIAGNOSIS — Z0000 Encounter for general adult medical examination without abnormal findings: Secondary | ICD-10-CM | POA: Diagnosis not present

## 2018-04-28 DIAGNOSIS — E559 Vitamin D deficiency, unspecified: Secondary | ICD-10-CM | POA: Diagnosis not present

## 2018-08-10 ENCOUNTER — Encounter (HOSPITAL_BASED_OUTPATIENT_CLINIC_OR_DEPARTMENT_OTHER): Payer: Self-pay | Admitting: Emergency Medicine

## 2018-08-10 ENCOUNTER — Emergency Department (HOSPITAL_BASED_OUTPATIENT_CLINIC_OR_DEPARTMENT_OTHER)
Admission: EM | Admit: 2018-08-10 | Discharge: 2018-08-11 | Disposition: A | Discharge status: Home / Self Care | Payer: 59 | Attending: Emergency Medicine | Admitting: Emergency Medicine

## 2018-08-10 ENCOUNTER — Other Ambulatory Visit: Payer: Self-pay

## 2018-08-10 DIAGNOSIS — Z8541 Personal history of malignant neoplasm of cervix uteri: Secondary | ICD-10-CM | POA: Insufficient documentation

## 2018-08-10 DIAGNOSIS — J45909 Unspecified asthma, uncomplicated: Secondary | ICD-10-CM | POA: Insufficient documentation

## 2018-08-10 DIAGNOSIS — Y93E1 Activity, personal bathing and showering: Secondary | ICD-10-CM | POA: Diagnosis not present

## 2018-08-10 DIAGNOSIS — S76212A Strain of adductor muscle, fascia and tendon of left thigh, initial encounter: Secondary | ICD-10-CM | POA: Diagnosis not present

## 2018-08-10 DIAGNOSIS — F17200 Nicotine dependence, unspecified, uncomplicated: Secondary | ICD-10-CM | POA: Insufficient documentation

## 2018-08-10 DIAGNOSIS — Y998 Other external cause status: Secondary | ICD-10-CM | POA: Diagnosis not present

## 2018-08-10 DIAGNOSIS — Y92012 Bathroom of single-family (private) house as the place of occurrence of the external cause: Secondary | ICD-10-CM | POA: Insufficient documentation

## 2018-08-10 DIAGNOSIS — S79912A Unspecified injury of left hip, initial encounter: Secondary | ICD-10-CM | POA: Diagnosis not present

## 2018-08-10 DIAGNOSIS — W010XXA Fall on same level from slipping, tripping and stumbling without subsequent striking against object, initial encounter: Secondary | ICD-10-CM | POA: Insufficient documentation

## 2018-08-10 DIAGNOSIS — S39011A Strain of muscle, fascia and tendon of abdomen, initial encounter: Secondary | ICD-10-CM | POA: Diagnosis not present

## 2018-08-10 NOTE — ED Triage Notes (Signed)
Patient complains of left hip pain sp fall 1 day ago. Difficulty with ambulation.

## 2018-08-11 ENCOUNTER — Emergency Department (HOSPITAL_BASED_OUTPATIENT_CLINIC_OR_DEPARTMENT_OTHER): Payer: 59

## 2018-08-11 DIAGNOSIS — S76212A Strain of adductor muscle, fascia and tendon of left thigh, initial encounter: Secondary | ICD-10-CM | POA: Diagnosis not present

## 2018-08-11 DIAGNOSIS — J45909 Unspecified asthma, uncomplicated: Secondary | ICD-10-CM | POA: Diagnosis not present

## 2018-08-11 DIAGNOSIS — F17200 Nicotine dependence, unspecified, uncomplicated: Secondary | ICD-10-CM | POA: Diagnosis not present

## 2018-08-11 DIAGNOSIS — Z8541 Personal history of malignant neoplasm of cervix uteri: Secondary | ICD-10-CM | POA: Diagnosis not present

## 2018-08-11 DIAGNOSIS — S79912A Unspecified injury of left hip, initial encounter: Secondary | ICD-10-CM | POA: Diagnosis not present

## 2018-08-11 MED ORDER — NAPROXEN 375 MG PO TABS: ORAL_TABLET | ORAL | 0 refills | Status: AC

## 2018-08-11 MED ORDER — NAPROXEN 250 MG PO TABS
500.0000 mg | ORAL_TABLET | Freq: Once | ORAL | Status: AC
  Administered 2018-08-11: 02:00:00 500 mg via ORAL
  Filled 2018-08-11: qty 2

## 2018-08-11 NOTE — ED Provider Notes (Signed)
[facility] Provider Note: [name], MD, FACEP  CSN: [id number] MRN: [id number] ARRIVAL: 08/10/18 at 2353 ROOM: [place]  Hip Injury   HISTORY OF PRESENT ILLNESS  08/11/18 1:45 AM Laura Sims is a 43 y.o. female who injured her left hip slipping in the shower a day ago.  She did not actually fall.  Her pain is located in the left groin fold and is worse with ambulation or attempted anterior flexion at the hip.  At rest her pain is minimal but she rates it a 10 out of 10 when attempting to ambulate.  She has been taking ibuprofen without adequate relief.  She denies other injury.   Past Medical History:  Diagnosis Date  . Asthma   . Cancer (HCC)    cervical   . Endometriosis   . IBS (irritable bowel syndrome)   . Intussusception [facility])     Past Surgical History:  Procedure Laterality Date  . ABDOMINAL SURGERY    . CARPAL TUNNEL RELEASE    . TONSILLECTOMY      No family history on file.  Social History   Tobacco Use  . Smoking status: Current Every Day Smoker  . Smokeless tobacco: Never Used  Substance Use Topics  . Alcohol use: Yes    Comment: occ  . Drug use: No    Prior to Admission medications   Medication Sig Start Date End Date Taking? Authorizing Provider  albuterol (PROVENTIL HFA;VENTOLIN HFA) 108 (90 BASE) MCG/ACT inhaler Inhale 1-2 puffs into the lungs every 6 (six) hours as needed for wheezing or shortness of breath.    [provider]  JOLIVETTE 0.35 MG tablet Take 1 tablet by mouth daily. 01/09/15   [provider]    Allergies Ambien [zolpidem tartrate], Benadryl [diphenhydramine hcl], and Versed [midazolam]   REVIEW OF SYSTEMS  Negative except as noted here or in the History of Present Illness.   PHYSICAL EXAMINATION  Initial Vital Signs Blood pressure 138/75, pulse 72, temperature 98.6 F (37 C), temperature source Oral, resp. rate 16, height 5\' 2"  (1.575 m), weight 70 kg, last  menstrual period 02/02/2015.  Examination General: Well-developed, well-nourished female in no acute distress; appearance consistent with age of record HENT: normocephalic; atraumatic Eyes: Normal appearance Neck: supple Heart: regular rate and rhythm Lungs: clear to auscultation bilaterally Abdomen: soft; nondistended; nontender; bowel sounds present Extremities: No deformity; full range of motion except limited anterior flexion at the left hip; pulses normal; tenderness of left anterior groin fold Neurologic: Awake, alert and oriented; motor function intact in all extremities and symmetric; no facial droop Skin: Warm and dry Psychiatric: Normal mood and affect   RESULTS  Summary of this visit's results, reviewed by myself:   EKG Interpretation  Date/Time:    Ventricular Rate:    PR Interval:    QRS Duration:   QT Interval:    QTC Calculation:   R Axis:     Text Interpretation:        Laboratory Studies: No results found for this or any previous visit (from the past 24 hour(s)). Imaging Studies: Dg Hip Unilat W Or Wo Pelvis 2-3 Views Left  Result Date: 08/11/2018 CLINICAL DATA:  Left hip pain after fall 1 day ago. EXAM: DG HIP (WITH OR WITHOUT PELVIS) 2-3V LEFT COMPARISON:  None. FINDINGS: The cortical margins of the bony pelvis and left hip are intact. No fracture. Pubic symphysis and sacroiliac joints are congruent. Both femoral heads are  well-seated in the respective acetabula. IMPRESSION: Negative radiographs of the pelvis and left hip. Electronically Signed   By: Keith Rake M.D.   On: 08/11/2018 01:09    ED COURSE and MDM  Nursing notes and initial vitals signs, including pulse oximetry, reviewed.  Vitals:   08/10/18 2356 08/11/18 0001  BP:  138/75  Pulse:  72  Resp:  16  Temp:  98.6 F (37 C)  TempSrc:  Oral  Weight: 70 kg   Height: 5\' 2"  (1.575 m)    History and examination consistent with sprain of the head of the quadriceps.  PROCEDURES    ED  DIAGNOSES     ICD-10-CM   1. Groin strain, left, initial encounter  T01.601U        Shanon Rosser, MD 08/11/18 [phone]

## 2018-08-29 MED FILL — ARMODAFINIL 150 MG TABLET: 150 | 30 days supply | Qty: 30 | Fill #1

## 2018-12-26 DIAGNOSIS — H52223 Regular astigmatism, bilateral: Secondary | ICD-10-CM | POA: Diagnosis not present

## 2018-12-26 DIAGNOSIS — H5213 Myopia, bilateral: Secondary | ICD-10-CM | POA: Diagnosis not present

## 2019-01-16 DIAGNOSIS — Z90710 Acquired absence of both cervix and uterus: Secondary | ICD-10-CM | POA: Diagnosis not present

## 2019-01-16 DIAGNOSIS — R87620 Atypical squamous cells of undetermined significance on cytologic smear of vagina (ASC-US): Secondary | ICD-10-CM | POA: Diagnosis not present

## 2019-01-16 DIAGNOSIS — Z1239 Encounter for other screening for malignant neoplasm of breast: Secondary | ICD-10-CM | POA: Diagnosis not present

## 2019-01-16 DIAGNOSIS — Z01411 Encounter for gynecological examination (general) (routine) with abnormal findings: Secondary | ICD-10-CM | POA: Diagnosis not present

## 2019-01-16 DIAGNOSIS — Z1272 Encounter for screening for malignant neoplasm of vagina: Secondary | ICD-10-CM | POA: Diagnosis not present

## 2019-01-16 DIAGNOSIS — Z1231 Encounter for screening mammogram for malignant neoplasm of breast: Secondary | ICD-10-CM | POA: Diagnosis not present

## 2019-01-16 DIAGNOSIS — Z01419 Encounter for gynecological examination (general) (routine) without abnormal findings: Secondary | ICD-10-CM | POA: Diagnosis not present

## 2019-01-16 DIAGNOSIS — R87811 Vaginal high risk human papillomavirus (HPV) DNA test positive: Secondary | ICD-10-CM | POA: Diagnosis not present

## 2019-01-16 DIAGNOSIS — Z1151 Encounter for screening for human papillomavirus (HPV): Secondary | ICD-10-CM | POA: Diagnosis not present

## 2019-05-07 MED FILL — MELOXICAM 15 MG TABLET: 15 | 30 days supply | Qty: 30 | Fill #0

## 2019-12-24 IMAGING — CR DG HIP (WITH OR WITHOUT PELVIS) 2-3V LEFT
2 series · 2 of 2 positions shown · non-contrast
Comparison: None.

CLINICAL DATA: Left hip pain after fall 1 day ago.

EXAM:
DG HIP (WITH OR WITHOUT PELVIS) 2-3V LEFT

[t hip ap left]
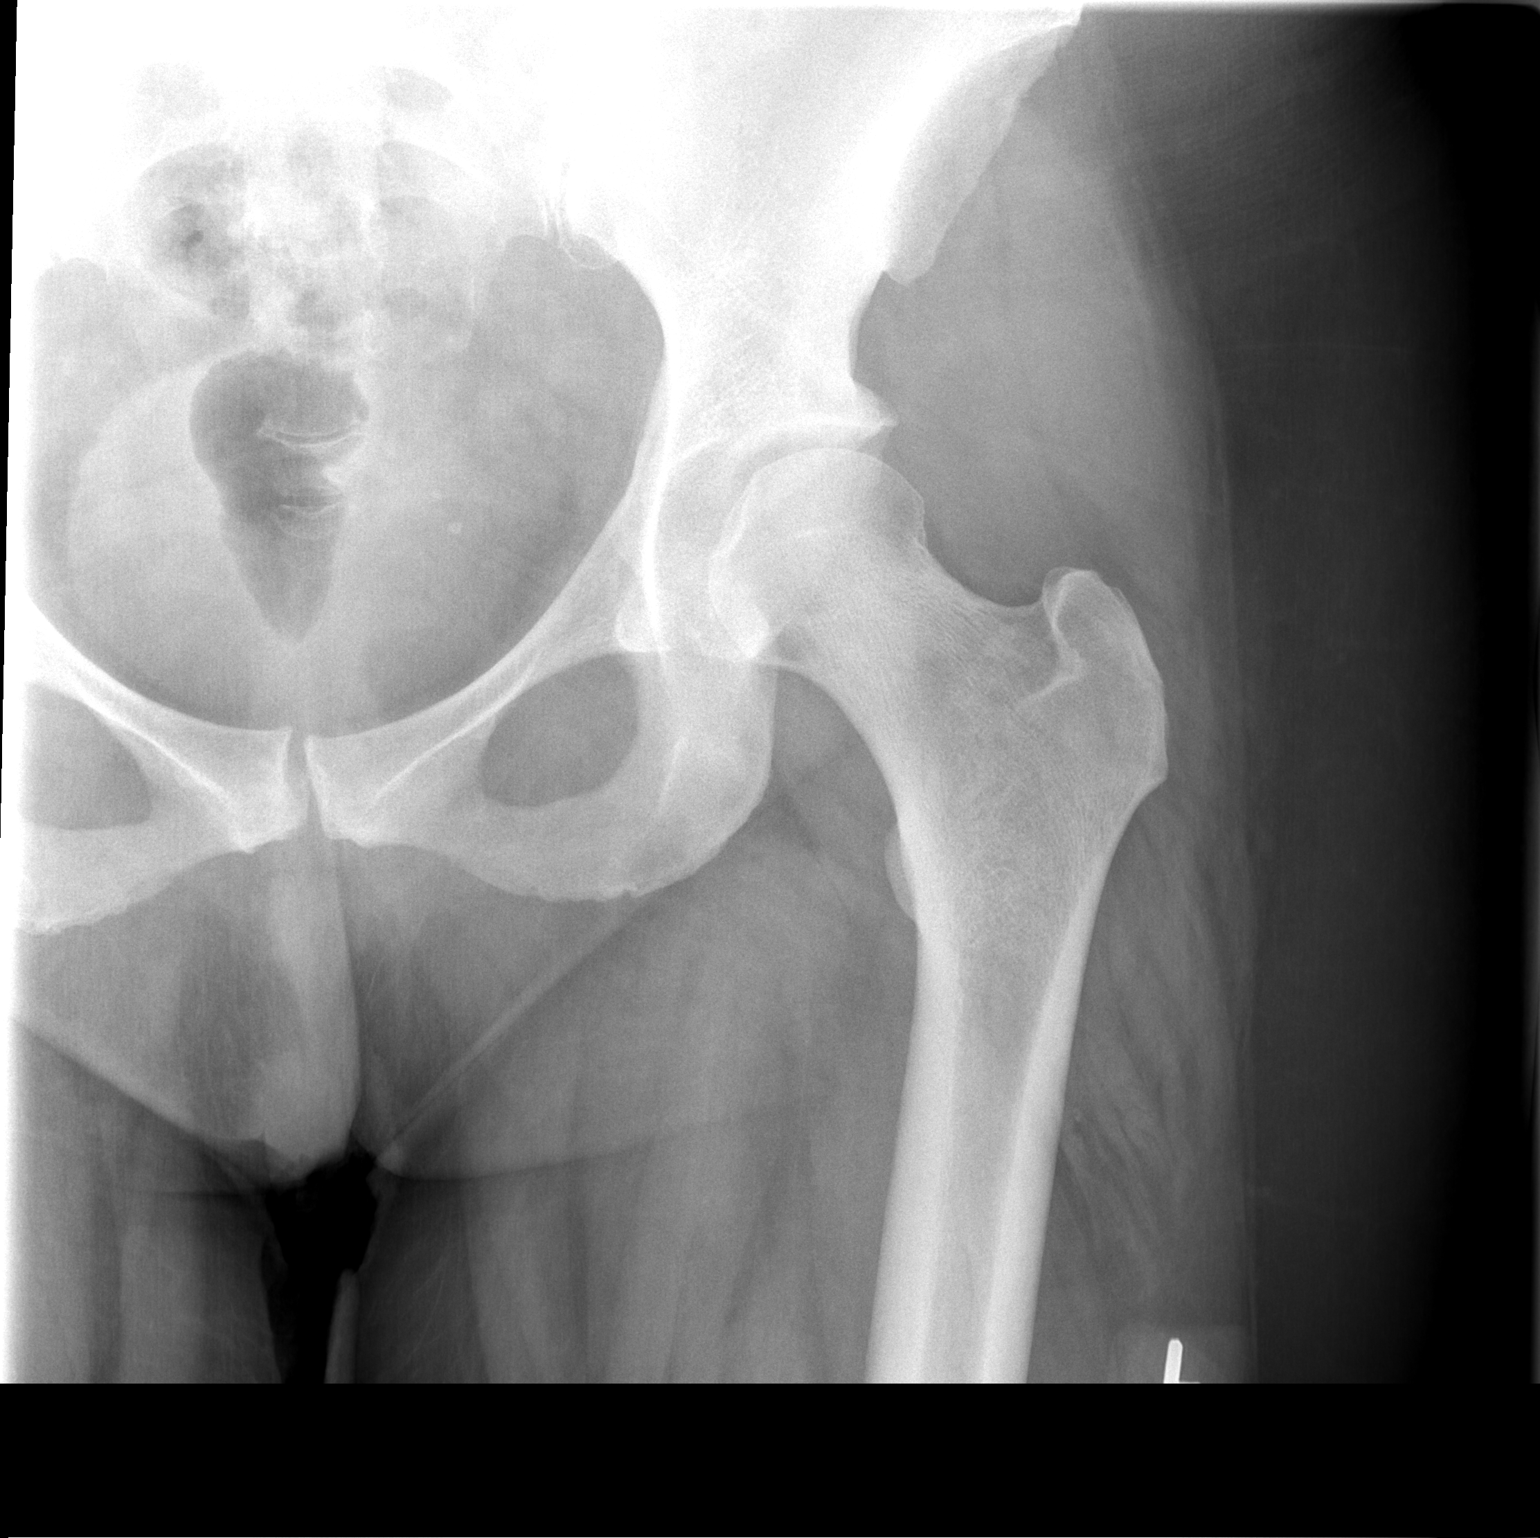

[t hip frog leg left]
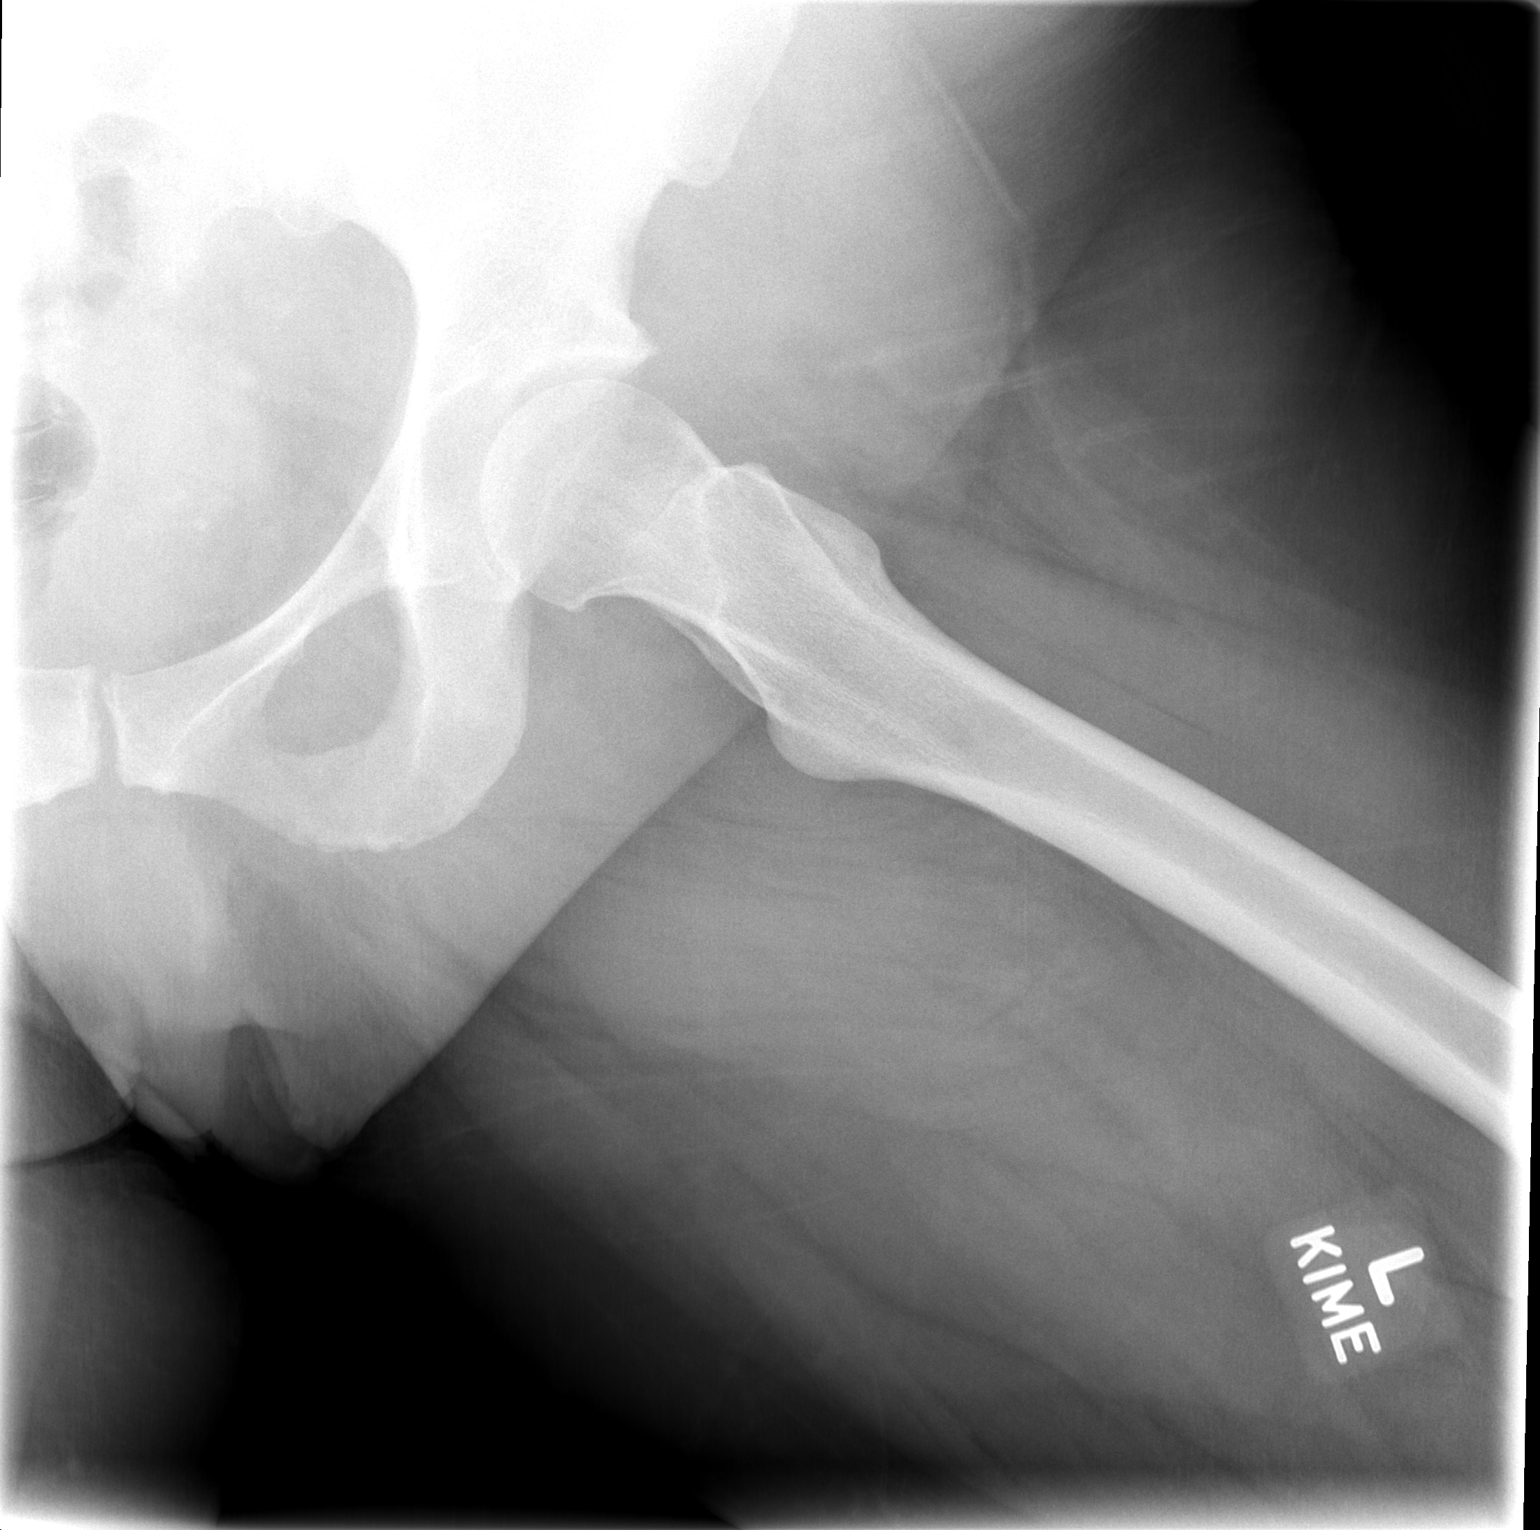

[2 of 2 positions shown; findings below may reference images not displayed]

FINDINGS: The cortical margins of the bony pelvis and left hip are intact. No
fracture. Pubic symphysis and sacroiliac joints are congruent. Both
femoral heads are well-seated in the respective acetabula.
IMPRESSION: Negative radiographs of the pelvis and left hip.

## 2020-03-04 DIAGNOSIS — H5213 Myopia, bilateral: Secondary | ICD-10-CM | POA: Diagnosis not present

## 2020-03-04 DIAGNOSIS — H52223 Regular astigmatism, bilateral: Secondary | ICD-10-CM | POA: Diagnosis not present

## 2020-03-13 DIAGNOSIS — N760 Acute vaginitis: Secondary | ICD-10-CM | POA: Diagnosis not present

## 2020-03-13 DIAGNOSIS — Z8619 Personal history of other infectious and parasitic diseases: Secondary | ICD-10-CM | POA: Diagnosis not present

## 2020-03-13 DIAGNOSIS — Z1151 Encounter for screening for human papillomavirus (HPV): Secondary | ICD-10-CM | POA: Diagnosis not present

## 2020-03-13 DIAGNOSIS — B9689 Other specified bacterial agents as the cause of diseases classified elsewhere: Secondary | ICD-10-CM | POA: Diagnosis not present

## 2020-03-13 DIAGNOSIS — Z90711 Acquired absence of uterus with remaining cervical stump: Secondary | ICD-10-CM | POA: Diagnosis not present

## 2020-03-13 DIAGNOSIS — Z01419 Encounter for gynecological examination (general) (routine) without abnormal findings: Secondary | ICD-10-CM | POA: Diagnosis not present

## 2020-05-05 DIAGNOSIS — M65311 Trigger thumb, right thumb: Secondary | ICD-10-CM | POA: Diagnosis not present

## 2020-05-06 DIAGNOSIS — Z0000 Encounter for general adult medical examination without abnormal findings: Secondary | ICD-10-CM | POA: Diagnosis not present

## 2020-05-06 DIAGNOSIS — E559 Vitamin D deficiency, unspecified: Secondary | ICD-10-CM | POA: Diagnosis not present

## 2020-05-08 ENCOUNTER — Other Ambulatory Visit (HOSPITAL_COMMUNITY): Payer: Self-pay | Admitting: Family Medicine

## 2020-05-08 DIAGNOSIS — R232 Flushing: Secondary | ICD-10-CM | POA: Diagnosis not present

## 2020-05-08 DIAGNOSIS — Z0000 Encounter for general adult medical examination without abnormal findings: Secondary | ICD-10-CM | POA: Diagnosis not present

## 2020-05-08 DIAGNOSIS — E559 Vitamin D deficiency, unspecified: Secondary | ICD-10-CM | POA: Diagnosis not present

## 2020-05-08 DIAGNOSIS — R7989 Other specified abnormal findings of blood chemistry: Secondary | ICD-10-CM | POA: Diagnosis not present

## 2020-05-15 ENCOUNTER — Other Ambulatory Visit (HOSPITAL_COMMUNITY): Payer: Self-pay | Admitting: Pharmacist

## 2020-05-15 MED FILL — CARESTART COVID-19 HOME TES: 4 days supply | Qty: 4 | Fill #0

## 2020-05-20 ENCOUNTER — Other Ambulatory Visit (HOSPITAL_COMMUNITY): Payer: Self-pay | Admitting: Orthopedic Surgery

## 2020-05-20 DIAGNOSIS — M65311 Trigger thumb, right thumb: Secondary | ICD-10-CM | POA: Diagnosis not present

## 2020-05-20 MED FILL — HYDROCODON-APAP 5-325: 5-325 | 3 days supply | Qty: 12 | Fill #0

## 2020-06-02 ENCOUNTER — Other Ambulatory Visit (HOSPITAL_BASED_OUTPATIENT_CLINIC_OR_DEPARTMENT_OTHER): Payer: Self-pay

## 2020-06-02 MED ORDER — VENLAFAXINE HCL ER 37.5 MG PO CP24
ORAL_CAPSULE | ORAL | 3 refills | Status: AC
  Filled 2020-06-02: qty 90, 90d supply, fill #0
  Filled 2020-09-10: qty 90, 90d supply, fill #1
  Filled 2020-12-17: qty 90, 90d supply, fill #2
  Filled 2021-03-24: qty 90, 90d supply, fill #3

## 2020-09-10 ENCOUNTER — Other Ambulatory Visit (HOSPITAL_BASED_OUTPATIENT_CLINIC_OR_DEPARTMENT_OTHER): Payer: Self-pay

## 2020-10-21 ENCOUNTER — Other Ambulatory Visit (HOSPITAL_BASED_OUTPATIENT_CLINIC_OR_DEPARTMENT_OTHER): Payer: Self-pay

## 2020-10-21 MED ORDER — AMOXICILLIN-POT CLAVULANATE 875-125 MG PO TABS
ORAL_TABLET | ORAL | 0 refills | Status: AC
  Filled 2020-10-21: qty 20, 10d supply, fill #0

## 2020-12-17 ENCOUNTER — Other Ambulatory Visit (HOSPITAL_BASED_OUTPATIENT_CLINIC_OR_DEPARTMENT_OTHER): Payer: Self-pay

## 2021-03-04 DIAGNOSIS — H35413 Lattice degeneration of retina, bilateral: Secondary | ICD-10-CM | POA: Diagnosis not present

## 2021-03-04 DIAGNOSIS — H16423 Pannus (corneal), bilateral: Secondary | ICD-10-CM | POA: Diagnosis not present

## 2021-03-24 ENCOUNTER — Other Ambulatory Visit (HOSPITAL_BASED_OUTPATIENT_CLINIC_OR_DEPARTMENT_OTHER): Payer: Self-pay

## 2021-04-09 DIAGNOSIS — F4320 Adjustment disorder, unspecified: Secondary | ICD-10-CM | POA: Diagnosis not present

## 2021-04-09 DIAGNOSIS — F4310 Post-traumatic stress disorder, unspecified: Secondary | ICD-10-CM | POA: Diagnosis not present

## 2021-05-07 DIAGNOSIS — E559 Vitamin D deficiency, unspecified: Secondary | ICD-10-CM | POA: Diagnosis not present

## 2021-05-07 DIAGNOSIS — K7689 Other specified diseases of liver: Secondary | ICD-10-CM | POA: Diagnosis not present

## 2021-05-07 DIAGNOSIS — Z0000 Encounter for general adult medical examination without abnormal findings: Secondary | ICD-10-CM | POA: Diagnosis not present

## 2021-05-07 DIAGNOSIS — E781 Pure hyperglyceridemia: Secondary | ICD-10-CM | POA: Diagnosis not present

## 2021-05-12 ENCOUNTER — Other Ambulatory Visit (HOSPITAL_BASED_OUTPATIENT_CLINIC_OR_DEPARTMENT_OTHER): Payer: Self-pay

## 2021-05-12 DIAGNOSIS — F439 Reaction to severe stress, unspecified: Secondary | ICD-10-CM | POA: Diagnosis not present

## 2021-05-12 DIAGNOSIS — Z0000 Encounter for general adult medical examination without abnormal findings: Secondary | ICD-10-CM | POA: Diagnosis not present

## 2021-05-12 DIAGNOSIS — F419 Anxiety disorder, unspecified: Secondary | ICD-10-CM | POA: Diagnosis not present

## 2021-05-12 MED ORDER — VENLAFAXINE HCL ER 75 MG PO CP24
ORAL_CAPSULE | ORAL | 0 refills | Status: DC
  Filled 2021-05-12: qty 30, 30d supply, fill #0

## 2021-05-26 DIAGNOSIS — F4310 Post-traumatic stress disorder, unspecified: Secondary | ICD-10-CM | POA: Diagnosis not present

## 2021-05-26 DIAGNOSIS — F4320 Adjustment disorder, unspecified: Secondary | ICD-10-CM | POA: Diagnosis not present

## 2021-05-27 ENCOUNTER — Other Ambulatory Visit (HOSPITAL_BASED_OUTPATIENT_CLINIC_OR_DEPARTMENT_OTHER): Payer: Self-pay

## 2021-05-27 DIAGNOSIS — F419 Anxiety disorder, unspecified: Secondary | ICD-10-CM | POA: Diagnosis not present

## 2021-05-27 MED ORDER — VENLAFAXINE HCL ER 75 MG PO CP24
ORAL_CAPSULE | ORAL | 3 refills | Status: DC
  Filled 2021-07-02: qty 90, 90d supply, fill #0
  Filled 2021-10-01: qty 90, 90d supply, fill #1
  Filled 2021-12-31: qty 90, 90d supply, fill #2
  Filled 2022-03-31: qty 90, 90d supply, fill #3

## 2021-07-02 ENCOUNTER — Other Ambulatory Visit (HOSPITAL_BASED_OUTPATIENT_CLINIC_OR_DEPARTMENT_OTHER): Payer: Self-pay

## 2021-08-03 DIAGNOSIS — B079 Viral wart, unspecified: Secondary | ICD-10-CM | POA: Diagnosis not present

## 2021-08-03 DIAGNOSIS — L821 Other seborrheic keratosis: Secondary | ICD-10-CM | POA: Diagnosis not present

## 2021-08-03 DIAGNOSIS — D485 Neoplasm of uncertain behavior of skin: Secondary | ICD-10-CM | POA: Diagnosis not present

## 2021-08-04 DIAGNOSIS — F4320 Adjustment disorder, unspecified: Secondary | ICD-10-CM | POA: Diagnosis not present

## 2021-08-04 DIAGNOSIS — F4310 Post-traumatic stress disorder, unspecified: Secondary | ICD-10-CM | POA: Diagnosis not present

## 2021-10-01 ENCOUNTER — Other Ambulatory Visit (HOSPITAL_BASED_OUTPATIENT_CLINIC_OR_DEPARTMENT_OTHER): Payer: Self-pay

## 2021-11-11 DIAGNOSIS — F4320 Adjustment disorder, unspecified: Secondary | ICD-10-CM | POA: Diagnosis not present

## 2021-12-17 DIAGNOSIS — Z1231 Encounter for screening mammogram for malignant neoplasm of breast: Secondary | ICD-10-CM | POA: Diagnosis not present

## 2021-12-22 ENCOUNTER — Other Ambulatory Visit (HOSPITAL_BASED_OUTPATIENT_CLINIC_OR_DEPARTMENT_OTHER): Payer: Self-pay

## 2021-12-22 MED ORDER — FLUARIX QUADRIVALENT 0.5 ML IM SUSY
PREFILLED_SYRINGE | INTRAMUSCULAR | 0 refills | Status: AC
  Filled 2021-12-22: qty 0.5, 1d supply, fill #0

## 2021-12-31 ENCOUNTER — Other Ambulatory Visit (HOSPITAL_BASED_OUTPATIENT_CLINIC_OR_DEPARTMENT_OTHER): Payer: Self-pay

## 2021-12-31 DIAGNOSIS — Z888 Allergy status to other drugs, medicaments and biological substances status: Secondary | ICD-10-CM | POA: Diagnosis not present

## 2021-12-31 DIAGNOSIS — Z01419 Encounter for gynecological examination (general) (routine) without abnormal findings: Secondary | ICD-10-CM | POA: Diagnosis not present

## 2021-12-31 DIAGNOSIS — Z113 Encounter for screening for infections with a predominantly sexual mode of transmission: Secondary | ICD-10-CM | POA: Diagnosis not present

## 2021-12-31 DIAGNOSIS — Z885 Allergy status to narcotic agent status: Secondary | ICD-10-CM | POA: Diagnosis not present

## 2021-12-31 DIAGNOSIS — Z90710 Acquired absence of both cervix and uterus: Secondary | ICD-10-CM | POA: Diagnosis not present

## 2022-01-29 DIAGNOSIS — F4320 Adjustment disorder, unspecified: Secondary | ICD-10-CM | POA: Diagnosis not present

## 2022-05-06 DIAGNOSIS — F4320 Adjustment disorder, unspecified: Secondary | ICD-10-CM | POA: Diagnosis not present

## 2022-05-20 ENCOUNTER — Other Ambulatory Visit (HOSPITAL_BASED_OUTPATIENT_CLINIC_OR_DEPARTMENT_OTHER): Payer: Self-pay

## 2022-05-20 DIAGNOSIS — F419 Anxiety disorder, unspecified: Secondary | ICD-10-CM | POA: Diagnosis not present

## 2022-05-20 DIAGNOSIS — R7989 Other specified abnormal findings of blood chemistry: Secondary | ICD-10-CM | POA: Diagnosis not present

## 2022-05-20 DIAGNOSIS — Z0000 Encounter for general adult medical examination without abnormal findings: Secondary | ICD-10-CM | POA: Diagnosis not present

## 2022-05-20 DIAGNOSIS — E559 Vitamin D deficiency, unspecified: Secondary | ICD-10-CM | POA: Diagnosis not present

## 2022-05-20 MED ORDER — TRIAMTERENE-HCTZ 37.5-25 MG PO TABS
0.5000 | ORAL_TABLET | Freq: Every morning | ORAL | 1 refills | Status: DC
  Filled 2022-05-20: qty 30, 60d supply, fill #0
  Filled 2023-01-01: qty 30, 60d supply, fill #1

## 2022-05-20 MED ORDER — VITAMIN D (ERGOCALCIFEROL) 1.25 MG (50000 UNIT) PO CAPS
50000.0000 [IU] | ORAL_CAPSULE | ORAL | 3 refills | Status: DC
  Filled 2022-05-20: qty 12, 84d supply, fill #0
  Filled 2023-01-01: qty 12, 84d supply, fill #1
  Filled 2023-04-19: qty 12, 84d supply, fill #2

## 2022-07-01 DIAGNOSIS — H35413 Lattice degeneration of retina, bilateral: Secondary | ICD-10-CM | POA: Diagnosis not present

## 2022-07-01 DIAGNOSIS — H16423 Pannus (corneal), bilateral: Secondary | ICD-10-CM | POA: Diagnosis not present

## 2022-07-05 ENCOUNTER — Other Ambulatory Visit (HOSPITAL_BASED_OUTPATIENT_CLINIC_OR_DEPARTMENT_OTHER): Payer: Self-pay

## 2022-07-07 ENCOUNTER — Other Ambulatory Visit (HOSPITAL_BASED_OUTPATIENT_CLINIC_OR_DEPARTMENT_OTHER): Payer: Self-pay

## 2022-07-07 MED ORDER — VENLAFAXINE HCL ER 75 MG PO CP24
75.0000 mg | ORAL_CAPSULE | Freq: Every day | ORAL | 3 refills | Status: DC
  Filled 2022-07-07: qty 90, 90d supply, fill #0
  Filled 2022-10-07: qty 90, 90d supply, fill #1
  Filled 2023-01-01: qty 90, 90d supply, fill #2
  Filled 2023-04-19: qty 90, 90d supply, fill #3

## 2022-09-08 DIAGNOSIS — F4320 Adjustment disorder, unspecified: Secondary | ICD-10-CM | POA: Diagnosis not present

## 2022-09-21 ENCOUNTER — Other Ambulatory Visit (HOSPITAL_BASED_OUTPATIENT_CLINIC_OR_DEPARTMENT_OTHER): Payer: Self-pay

## 2022-09-21 MED ORDER — CIPROFLOXACIN-DEXAMETHASONE 0.3-0.1 % OT SUSP
4.0000 [drp] | Freq: Two times a day (BID) | OTIC | 0 refills | Status: AC
  Filled 2022-09-21: qty 7.5, 7d supply, fill #0

## 2022-09-21 MED ORDER — CIPROFLOXACIN-DEXAMETHASONE 0.3-0.1 % OT SUSP: 4.0000 [drp] | Freq: Two times a day (BID) | OTIC | 0 refills | Status: AC

## 2022-09-22 ENCOUNTER — Other Ambulatory Visit (HOSPITAL_BASED_OUTPATIENT_CLINIC_OR_DEPARTMENT_OTHER): Payer: Self-pay

## 2022-12-13 ENCOUNTER — Other Ambulatory Visit (HOSPITAL_BASED_OUTPATIENT_CLINIC_OR_DEPARTMENT_OTHER): Payer: Self-pay

## 2022-12-13 MED ORDER — CEPHALEXIN 500 MG PO CAPS
500.0000 mg | ORAL_CAPSULE | Freq: Three times a day (TID) | ORAL | 0 refills | Status: AC
  Filled 2022-12-13: qty 21, 7d supply, fill #0

## 2022-12-15 ENCOUNTER — Other Ambulatory Visit (HOSPITAL_BASED_OUTPATIENT_CLINIC_OR_DEPARTMENT_OTHER): Payer: Self-pay

## 2022-12-15 DIAGNOSIS — H00021 Hordeolum internum right upper eyelid: Secondary | ICD-10-CM | POA: Diagnosis not present

## 2022-12-15 MED ORDER — OFLOXACIN 0.3 % OP SOLN
1.0000 [drp] | Freq: Four times a day (QID) | OPHTHALMIC | 0 refills | Status: AC
  Filled 2022-12-15: qty 5, 25d supply, fill #0

## 2023-01-13 ENCOUNTER — Other Ambulatory Visit (HOSPITAL_BASED_OUTPATIENT_CLINIC_OR_DEPARTMENT_OTHER): Payer: Self-pay

## 2023-04-17 ENCOUNTER — Emergency Department (HOSPITAL_BASED_OUTPATIENT_CLINIC_OR_DEPARTMENT_OTHER)
Admission: EM | Admit: 2023-04-17 | Discharge: 2023-04-18 | Disposition: A | Discharge status: Home / Self Care | Payer: Self-pay | Attending: Emergency Medicine | Admitting: Emergency Medicine

## 2023-04-17 ENCOUNTER — Encounter (HOSPITAL_BASED_OUTPATIENT_CLINIC_OR_DEPARTMENT_OTHER): Payer: Self-pay | Admitting: Emergency Medicine

## 2023-04-17 DIAGNOSIS — R197 Diarrhea, unspecified: Secondary | ICD-10-CM | POA: Insufficient documentation

## 2023-04-17 DIAGNOSIS — R112 Nausea with vomiting, unspecified: Secondary | ICD-10-CM | POA: Diagnosis not present

## 2023-04-17 DIAGNOSIS — J45909 Unspecified asthma, uncomplicated: Secondary | ICD-10-CM | POA: Diagnosis not present

## 2023-04-17 DIAGNOSIS — Z8541 Personal history of malignant neoplasm of cervix uteri: Secondary | ICD-10-CM | POA: Insufficient documentation

## 2023-04-17 NOTE — ED Provider Notes (Signed)
 [facility] Provider Note  CSN: [id number] Arrival date & time: 04/17/23 2345  Chief Complaint(s) Emesis  HPI Laura Sims is a 48 y.o. female here with nausea, NBNB emesis, NB watery diarrhea. No sick contacts or suspicious food intact. No recent Abd.   The history is provided by the patient.  GI Problem This is a new problem. The current episode started 12 to 24 hours ago. The problem occurs constantly. The problem has not changed since onset.Pertinent negatives include no chest pain, no abdominal pain, no headaches and no shortness of breath. Nothing aggravates the symptoms. Nothing relieves the symptoms.    Past Medical History Past Medical History:  Diagnosis Date   Asthma    Cancer (HCC)    cervical    Endometriosis    IBS (irritable bowel syndrome)    Intussusception (HCC)    There are no active problems to display for this patient.  Home Medication(s) Prior to Admission medications   Medication Sig Start Date End Date Taking? Authorizing Provider  albuterol (PROVENTIL HFA;VENTOLIN HFA) 108 (90 BASE) MCG/ACT inhaler Inhale 1-2 puffs into the lungs every 6 (six) hours as needed for wheezing or shortness of breath.    [provider]  amoxicillin-clavulanate (AUGMENTIN) 875-125 MG tablet Take 1 tablet by mouth every 12 hours for 10 days 10/21/20     cephALEXin (KEFLEX) 500 MG capsule Take 1 capsule (500 mg total) by mouth 3 (three) times daily for 7 days. 12/11/22   Glyn Ade, MD  ciprofloxacin-dexamethasone (CIPRODEX) OTIC suspension Place 4 drops into both ears 2 (two) times daily for 7 days. 09/21/22   Pollyann Savoy, MD  ciprofloxacin-dexamethasone (CIPRODEX) OTIC suspension Place 4 drops into the left ear 2 (two) times daily for 7 days. 09/18/22   Molpus, John, MD  influenza vac split quadrivalent PF (FLUARIX QUADRIVALENT) 0.5 ML injection Inject into the muscle. 12/22/21   Judyann Munson, MD  naproxen  (NAPROSYN) 375 MG tablet Take 1 tablet twice daily as needed for pain. 08/11/18   Molpus, Jonny Ruiz, MD  ofloxacin (OCUFLOX) 0.3 % ophthalmic solution Instill 1 drop into right eye four times a day 12/15/22     triamterene-hydrochlorothiazide (MAXZIDE-25) 37.5-25 MG tablet Take 0.5 tablets by mouth in the morning as needed. 05/20/22     venlafaxine XR (EFFEXOR-XR) 37.5 MG 24 hr capsule Take 1 capsule by mouth with food once a day 06/02/20     venlafaxine XR (EFFEXOR-XR) 37.5 MG 24 hr capsule TAKE 1 CAPSULE BY MOUTH ONCE DAILY WITH FOOD 05/08/20 05/08/21  Irena Reichmann, DO  venlafaxine XR (EFFEXOR-XR) 75 MG 24 hr capsule Take 1 capsule (75 mg total) by mouth daily with food 07/07/22     Vitamin D, Ergocalciferol, (DRISDOL) 1.25 MG (50000 UNIT) CAPS capsule Take 1 capsule (50,000 Units total) by mouth once a week. 05/20/22     JOLIVETTE 0.35 MG tablet Take 1 tablet by mouth daily. 01/09/15 08/11/18  [provider]  Allergies Ambien [zolpidem tartrate], Benadryl [diphenhydramine hcl], and Versed [midazolam]  Review of Systems Review of Systems  Respiratory:  Negative for shortness of breath.   Cardiovascular:  Negative for chest pain.  Gastrointestinal:  Negative for abdominal pain.  Neurological:  Negative for headaches.   As noted in HPI  Physical Exam Vital Signs  I have reviewed the triage vital signs BP (!) 175/91   Pulse 86   Temp 98.8 F (37.1 C) (Oral)   Resp 20   Ht 5\' 3"  (1.6 m)   Wt 90.7 kg   LMP 02/02/2015 Comment: negative urine pregnancy test 02/10/15  SpO2 96%   BMI 35.43 kg/m   Physical Exam Vitals reviewed.  Constitutional:      General: She is not in acute distress.    Appearance: She is well-developed. She is not diaphoretic.  HENT:     Head: Normocephalic and atraumatic.     Right Ear: External ear normal.     Left Ear: External  ear normal.     Nose: Nose normal.  Eyes:     General: No scleral icterus.    Conjunctiva/sclera: Conjunctivae normal.  Neck:     Trachea: Phonation normal.  Cardiovascular:     Rate and Rhythm: Normal rate and regular rhythm.  Pulmonary:     Effort: Pulmonary effort is normal. No respiratory distress.     Breath sounds: No stridor.  Abdominal:     General: There is no distension.     Tenderness: There is no abdominal tenderness. There is no guarding or rebound.  Musculoskeletal:        General: Normal range of motion.     Cervical back: Normal range of motion.  Neurological:     Mental Status: She is alert and oriented to person, place, and time.  Psychiatric:        Behavior: Behavior normal.     ED Results and Treatments Labs (all labs ordered are listed, but only abnormal results are displayed) Labs Reviewed  CBC WITH DIFFERENTIAL/PLATELET - Abnormal; Notable for the following components:      Result Value   RBC 5.24 (*)    Hemoglobin 15.6 (*)    Neutro Abs 9.2 (*)    Lymphs Abs 0.6 (*)    All other components within normal limits  COMPREHENSIVE METABOLIC PANEL - Abnormal; Notable for the following components:   Sodium 133 (*)    Glucose, Bld 175 (*)    Total Protein 8.2 (*)    AST 90 (*)    ALT 97 (*)    All other components within normal limits  LIPASE, BLOOD                                                                                                                         EKG  EKG Interpretation Date/Time:    Ventricular Rate:    PR Interval:    QRS Duration:    QT Interval:    QTC  Calculation:   R Axis:      Text Interpretation:         Radiology No results found.  Medications Ordered in ED Medications  sodium chloride 0.9 % bolus 1,000 mL (0 mLs Intravenous Stopped 04/18/23 0203)  ondansetron (ZOFRAN) injection 4 mg (4 mg Intravenous Given 04/18/23 0019)   Procedures Procedures  (including critical care time) Medical Decision Making /  ED Course   Medical Decision Making Amount and/or Complexity of Data Reviewed Labs: ordered.  Risk Prescription drug management.     Clinical Course as of 04/18/23 0226  Mon Apr 18, 2023  0050 Nausea, vomiting, diarrhea  Differential diagnosis considered and workup below  Favoring viral gastroenteritis.  Abdomen is benign so I have low suspicion for serious intra-abdominal inflammatory/infectious process.  Labs ordered to assess for any electrolyte or metabolic derangements.  CBC without leukocytosis.  No anemia.  Hemoconcentrated likely due to GI losses.  CMP without significant electrolyte derangements.  No renal insufficiency.  She does have mild hyperglycemia without DKA.  Mildly elevated LFTs likely reactive and secondary to GI process.  No evidence of bili obstruction or pancreatitis.  Patient was provided with IV Zofran and fluids. [PC]  0225 After nausea medicine and IV fluids, patient reports feeling better.  Able to tolerate p.o. intake. [PC]    Clinical Course User Index [PC] Refael Fulop, Amadeo Garnet, MD    Final Clinical Impression(s) / ED Diagnoses Final diagnoses:  Nausea vomiting and diarrhea   The patient appears reasonably screened and/or stabilized for discharge and I doubt any other medical condition or other [facility] requiring further screening, evaluation, or treatment in the ED at this time. I have discussed the findings, Dx and Tx plan with the patient/family who expressed understanding and agree(s) with the plan. Discharge instructions discussed at length. The patient/family was given strict return precautions who verbalized understanding of the instructions. No further questions at time of discharge.  Disposition: Discharge  Condition: Good  ED Discharge Orders     None         Follow Up: Pearson Grippe, MD [address] [place] [address] [phone]  Call  to schedule an appointment for close follow up    This chart was  dictated using voice recognition software.  Despite best efforts to proofread,  errors can occur which can change the documentation meaning.    Nira Conn, MD 04/18/23 [id number]

## 2023-04-17 NOTE — ED Triage Notes (Signed)
 NVD starting today has tried OTC and Rx meds with no relief.

## 2023-04-18 LAB — CBC WITH DIFFERENTIAL/PLATELET
Abs Immature Granulocytes: 0.05 10*3/uL (ref 0.00–0.07)
Basophils Absolute: 0 10*3/uL (ref 0.0–0.1)
Basophils Relative: 0 %
Eosinophils Absolute: 0 10*3/uL (ref 0.0–0.5)
Eosinophils Relative: 0 %
HCT: 44.2 % (ref 36.0–46.0)
Hemoglobin: 15.6 g/dL — ABNORMAL HIGH (ref 12.0–15.0)
Immature Granulocytes: 1 %
Lymphocytes Relative: 6 %
Lymphs Abs: 0.6 10*3/uL — ABNORMAL LOW (ref 0.7–4.0)
MCH: 29.8 pg (ref 26.0–34.0)
MCHC: 35.3 g/dL (ref 30.0–36.0)
MCV: 84.4 fL (ref 80.0–100.0)
Monocytes Absolute: 0.2 10*3/uL (ref 0.1–1.0)
Monocytes Relative: 2 %
Neutro Abs: 9.2 10*3/uL — ABNORMAL HIGH (ref 1.7–7.7)
Neutrophils Relative %: 91 %
Platelets: 239 10*3/uL (ref 150–400)
RBC: 5.24 MIL/uL — ABNORMAL HIGH (ref 3.87–5.11)
RDW: 13 % (ref 11.5–15.5)
WBC: 10 10*3/uL (ref 4.0–10.5)
nRBC: 0 % (ref 0.0–0.2)

## 2023-04-18 LAB — COMPREHENSIVE METABOLIC PANEL
ALT: 97 U/L — ABNORMAL HIGH (ref 0–44)
AST: 90 U/L — ABNORMAL HIGH (ref 15–41)
Albumin: 4.6 g/dL (ref 3.5–5.0)
Alkaline Phosphatase: 63 U/L (ref 38–126)
Anion gap: 12 (ref 5–15)
BUN: 17 mg/dL (ref 6–20)
CO2: 22 mmol/L (ref 22–32)
Calcium: 9.2 mg/dL (ref 8.9–10.3)
Chloride: 99 mmol/L (ref 98–111)
Creatinine, Ser: 0.69 mg/dL (ref 0.44–1.00)
GFR, Estimated: >60 mL/min (ref >60)
Glucose, Bld: 175 mg/dL — ABNORMAL HIGH (ref 70–99)
Potassium: 3.7 mmol/L (ref 3.5–5.1)
Sodium: 133 mmol/L — ABNORMAL LOW (ref 135–145)
Total Bilirubin: 1 mg/dL (ref 0.0–1.2)
Total Protein: 8.2 g/dL — ABNORMAL HIGH (ref 6.5–8.1)

## 2023-04-18 LAB — LIPASE, BLOOD: Lipase: 19 U/L (ref 11–51)

## 2023-04-18 MED ORDER — ONDANSETRON HCL 4 MG/2ML IJ SOLN
4.0000 mg | Freq: Once | INTRAMUSCULAR | Status: AC
  Administered 2023-04-18: 4 mg via INTRAVENOUS
  Filled 2023-04-18: qty 2

## 2023-04-18 MED ORDER — SODIUM CHLORIDE 0.9 % IV BOLUS
1000.0000 mL | Freq: Once | INTRAVENOUS | Status: AC
  Administered 2023-04-18: 1000 mL via INTRAVENOUS

## 2023-04-28 DIAGNOSIS — Z1231 Encounter for screening mammogram for malignant neoplasm of breast: Secondary | ICD-10-CM | POA: Diagnosis not present

## 2023-05-10 DIAGNOSIS — I10 Essential (primary) hypertension: Secondary | ICD-10-CM | POA: Diagnosis not present

## 2023-05-10 DIAGNOSIS — Z87410 Personal history of cervical dysplasia: Secondary | ICD-10-CM | POA: Diagnosis not present

## 2023-05-10 DIAGNOSIS — Z01419 Encounter for gynecological examination (general) (routine) without abnormal findings: Secondary | ICD-10-CM | POA: Diagnosis not present

## 2023-05-10 DIAGNOSIS — R232 Flushing: Secondary | ICD-10-CM | POA: Diagnosis not present

## 2023-05-10 DIAGNOSIS — Z90710 Acquired absence of both cervix and uterus: Secondary | ICD-10-CM | POA: Diagnosis not present

## 2023-05-17 DIAGNOSIS — Z0000 Encounter for general adult medical examination without abnormal findings: Secondary | ICD-10-CM | POA: Diagnosis not present

## 2023-05-17 DIAGNOSIS — E559 Vitamin D deficiency, unspecified: Secondary | ICD-10-CM | POA: Diagnosis not present

## 2023-05-24 ENCOUNTER — Other Ambulatory Visit (HOSPITAL_BASED_OUTPATIENT_CLINIC_OR_DEPARTMENT_OTHER): Payer: Self-pay

## 2023-05-24 DIAGNOSIS — Z0000 Encounter for general adult medical examination without abnormal findings: Secondary | ICD-10-CM | POA: Diagnosis not present

## 2023-05-24 DIAGNOSIS — I10 Essential (primary) hypertension: Secondary | ICD-10-CM | POA: Diagnosis not present

## 2023-05-24 MED ORDER — LOSARTAN POTASSIUM 50 MG PO TABS
50.0000 mg | ORAL_TABLET | Freq: Every day | ORAL | 0 refills | Status: AC
  Filled 2023-05-24: qty 30, 30d supply, fill #0

## 2023-06-15 ENCOUNTER — Emergency Department (HOSPITAL_BASED_OUTPATIENT_CLINIC_OR_DEPARTMENT_OTHER)
Admission: EM | Admit: 2023-06-15 | Discharge: 2023-06-15 | Disposition: A | Discharge status: Home / Self Care | Attending: Emergency Medicine | Admitting: Emergency Medicine

## 2023-06-15 ENCOUNTER — Encounter (HOSPITAL_BASED_OUTPATIENT_CLINIC_OR_DEPARTMENT_OTHER): Payer: Self-pay | Admitting: Emergency Medicine

## 2023-06-15 DIAGNOSIS — W260XXA Contact with knife, initial encounter: Secondary | ICD-10-CM | POA: Diagnosis not present

## 2023-06-15 DIAGNOSIS — S61211A Laceration without foreign body of left index finger without damage to nail, initial encounter: Secondary | ICD-10-CM | POA: Diagnosis not present

## 2023-06-15 DIAGNOSIS — S61412A Laceration without foreign body of left hand, initial encounter: Secondary | ICD-10-CM | POA: Diagnosis not present

## 2023-06-15 DIAGNOSIS — S6992XA Unspecified injury of left wrist, hand and finger(s), initial encounter: Secondary | ICD-10-CM | POA: Diagnosis not present

## 2023-06-15 MED ORDER — LIDOCAINE HCL (PF) 1 % IJ SOLN
15.0000 mL | Freq: Once | INTRAMUSCULAR | Status: DC
  Filled 2023-06-15: qty 15

## 2023-06-15 NOTE — ED Triage Notes (Signed)
 Laceration to top of left hand and second finger.

## 2023-06-15 NOTE — ED Provider Notes (Signed)
 [place] [facility] Provider Note   CSN: [id number] Arrival date & time: 06/15/23  2101     History  Chief Complaint  Patient presents with   Laceration    Laura Sims is a 48 y.o. female who presents with concern for two lacerations to the top of her left hand that she sustained just prior to arrival.  States she was cutting a box with a knife, and the knife slipped and cut her hand.  Denies any numbness or tingling in her hand or fingers.  Bleeding well-controlled upon arrival.  She states her tetanus is up-to-date.   Laceration      Home Medications Prior to Admission medications   Medication Sig Start Date End Date Taking? Authorizing Provider  albuterol (PROVENTIL HFA;VENTOLIN HFA) 108 (90 BASE) MCG/ACT inhaler Inhale 1-2 puffs into the lungs every 6 (six) hours as needed for wheezing or shortness of breath.    [provider]  amoxicillin -clavulanate (AUGMENTIN ) 875-125 MG tablet Take 1 tablet by mouth every 12 hours for 10 days 10/21/20     cephALEXin  (KEFLEX ) 500 MG capsule Take 1 capsule (500 mg total) by mouth 3 (three) times daily for 7 days. 12/11/22   Onetha Bile, MD  ciprofloxacin -dexamethasone  (CIPRODEX ) OTIC suspension Place 4 drops into both ears 2 (two) times daily for 7 days. 09/21/22   Charmayne Cooper, MD  ciprofloxacin -dexamethasone  (CIPRODEX ) OTIC suspension Place 4 drops into the left ear 2 (two) times daily for 7 days. 09/18/22   Molpus, John, MD  influenza vac split quadrivalent PF (FLUARIX QUADRIVALENT ) 0.5 ML injection Inject into the muscle. 12/22/21   Liane Redman, MD  losartan  (COZAAR ) 50 MG tablet Take 1 tablet (50 mg total) by mouth daily. 05/24/23     naproxen  (NAPROSYN ) 375 MG tablet Take 1 tablet twice daily as needed for pain. 08/11/18   Molpus, Autry Legions, MD  ofloxacin  (OCUFLOX ) 0.3 % ophthalmic solution Instill 1 drop into right eye four times a day 12/15/22     triamterene -hydrochlorothiazide   (MAXZIDE -25) 37.5-25 MG tablet Take 0.5 tablets by mouth in the morning as needed. 05/20/22     venlafaxine  XR (EFFEXOR -XR) 37.5 MG 24 hr capsule Take 1 capsule by mouth with food once a day 06/02/20     venlafaxine  XR (EFFEXOR -XR) 37.5 MG 24 hr capsule TAKE 1 CAPSULE BY MOUTH ONCE DAILY WITH FOOD 05/08/20 05/08/21  Collins, Dana, DO  venlafaxine  XR (EFFEXOR -XR) 75 MG 24 hr capsule Take 1 capsule (75 mg total) by mouth daily with food 07/07/22     Vitamin D , Ergocalciferol , (DRISDOL ) 1.25 MG (50000 UNIT) CAPS capsule Take 1 capsule (50,000 Units total) by mouth once a week. 05/20/22     JOLIVETTE 0.35 MG tablet Take 1 tablet by mouth daily. 01/09/15 08/11/18  [provider]      Allergies    Ambien [zolpidem tartrate], Benadryl [diphenhydramine hcl], and Versed [midazolam]    Review of Systems   Review of Systems  Skin:  Positive for wound.    Physical Exam Updated Vital Signs BP (!) 148/62 (BP Location: Left Arm)   Pulse 95   Temp 98.7 F (37.1 C) (Oral)   Resp 16   Ht 5\' 3"  (1.6 m)   Wt 90 kg   LMP 02/02/2015 Comment: negative urine pregnancy test 02/10/15  SpO2 96%   BMI 35.15 kg/m  Physical Exam Vitals and nursing note reviewed.  Constitutional:      Appearance: Normal appearance.  HENT:  Head: Atraumatic.  Cardiovascular:     Comments: 2+ radial pulse bilaterally Pulmonary:     Effort: Pulmonary effort is normal.  Musculoskeletal:     Comments: Able to fully flex and extend at the left hand 1st and 5th MCPs, PIPs, DIPs  Skin:    Comments: 3cm laceration to the dorsum of the patient's hand just proximal to the third finger  3 cm laceration over the dorsum of patient's left second digit  Bleeding well-controlled upon arrival.  No foreign body.  No exposed bone.  Neurological:     General: No focal deficit present.     Mental Status: She is alert.     Comments: Intact sensation of the left hand 1st through 5th digits  Psychiatric:        Mood and Affect:  Mood normal.        Behavior: Behavior normal.     ED Results / Procedures / Treatments   Labs (all labs ordered are listed, but only abnormal results are displayed) Labs Reviewed - No data to display  EKG None  Radiology No results found.  Procedures .Laceration Repair  Date/Time: 06/15/2023 11:55 PM  Performed by: Rexie Catena, PA-C Authorized by: Rexie Catena, PA-C   Consent:    Consent obtained:  Verbal   Consent given by:  Patient   Risks, benefits, and alternatives were discussed: yes     Risks discussed:  Infection, pain, poor cosmetic result and poor wound healing   Alternatives discussed:  No treatment Universal protocol:    Procedure explained and questions answered to patient or proxy's satisfaction: yes     Patient identity confirmed:  Verbally with patient Anesthesia:    Anesthesia method:  Local infiltration   Local anesthetic:  Lidocaine  1% w/o epi (4ml) Laceration details:    Location:  Hand   Hand location:  L hand, dorsum   Length (cm):  3   Depth (mm):  3 Pre-procedure details:    Preparation:  Patient was prepped and draped in usual sterile fashion Exploration:    Wound exploration: wound explored through full range of motion and entire depth of wound visualized     Wound extent: no foreign body, no signs of injury, no nerve damage, no tendon damage, no underlying fracture and no vascular damage     Contaminated: no   Treatment:    Area cleansed with:  Chlorhexidine   Irrigation solution:  Sterile saline   Irrigation volume:  1L   Irrigation method:  Syringe   Debridement:  None   Undermining:  None Skin repair:    Repair method:  Sutures   Suture size:  5-0   Suture material:  Prolene   Suture technique:  Simple interrupted   Number of sutures:  5 Repair type:    Repair type:  Simple Post-procedure details:    Dressing:  Antibiotic ointment   Procedure completion:  Tolerated well, no immediate complications .Laceration  Repair  Date/Time: 06/15/2023 11:57 PM  Performed by: Rexie Catena, PA-C Authorized by: Rexie Catena, PA-C   Consent:    Consent obtained:  Verbal   Consent given by:  Patient   Risks discussed:  Infection, poor cosmetic result, pain and poor wound healing Universal protocol:    Patient identity confirmed:  Verbally with patient Anesthesia:    Anesthesia method:  Local infiltration   Local anesthetic:  Lidocaine  1% w/o epi (3ml) Laceration details:    Location:  Finger   Finger location:  L index  finger   Length (cm):  3   Depth (mm):  3 Pre-procedure details:    Preparation:  Patient was prepped and draped in usual sterile fashion Exploration:    Wound exploration: wound explored through full range of motion and entire depth of wound visualized     Wound extent: no foreign body, no nerve damage, no tendon damage and no vascular damage   Treatment:    Area cleansed with:  Chlorhexidine   Irrigation solution:  Sterile saline   Irrigation volume:  1L   Irrigation method:  Syringe   Debridement:  None Skin repair:    Repair method:  Sutures   Suture size:  5-0   Suture material:  Prolene   Suture technique:  Simple interrupted   Number of sutures:  4 Repair type:    Repair type:  Simple Post-procedure details:    Dressing:  Antibiotic ointment   Procedure completion:  Tolerated well, no immediate complications     Medications Ordered in ED Medications  lidocaine  (PF) (XYLOCAINE ) 1 % injection 15 mL (has no administration in time range)    ED Course/ Medical Decision Making/ A&P                                 Medical Decision Making Risk Prescription drug management.     Differential diagnosis includes but is not limited to laceration, tendon injury, nerve injury  ED Course:  Upon initial evaluation, patient is well-appearing, stable vital signs.  Sustained 2 lacerations; one to the dorsum of the left hand and one over the left second digit.   Neurovascularly intact in the left upper extremity.  Able to fully flex and extend at the 1st through 5th MCPs, PIPs, DIPs of the left hand, no concern for tendon injury.  No retained foreign body.  The lacerations were irrigated well with saline and closed with simple interrupted sutures as per note above.  Remains neurovascularly intact after suture placement with full range of motion of the left hand and 1st-5th digits. No indication for tetanus as patient reports this is up-to-date.    Medications Given: Declines pain medicine   Impression: 3 cm laceration to dorsum of left hand 3 cm laceration to dorsum of patient's left second finger  Disposition:  The patient was discharged home with instructions to have sutures removed in 7 to 10 days.  Keep area clean with soap and water.  No submerging area until fully healed.  Tylenol and ibuprofen as needed for pain. Return precautions given.     This chart was dictated using voice recognition software, Dragon. Despite the best efforts of this provider to proofread and correct errors, errors may still occur which can change documentation meaning.          Final Clinical Impression(s) / ED Diagnoses Final diagnoses:  Laceration of left hand without foreign body, initial encounter    Rx / DC Orders ED Discharge Orders     None         Rexie Catena, PA-C 06/15/23 2359    Tegeler, Marine Sia, MD 06/16/23 1500

## 2023-06-15 NOTE — Discharge Instructions (Signed)
 You had 9 non-absorbable sutures placed today. You must get your sutures rechecked for possible removal in 7-10 days. We recommend visiting your PCP or an urgent care for suture removal. However, you may also return back to the ER if you are unable to be seen by your PCP or at urgent care.   You may gently clean the area around your laceration as needed with soap and water. Place antibiotic ointment such as bacitracin or neosporin over your laceration after cleaning the area.  Keep the laceration covered with sterile gauze as shown here if you are doing an activity in which it may get dirty. You may pick these supplies up at any drugstore.  Do not submerge your laceration in water (no baths, swimming) until it is fully healed. You may shower.   You may take up to 1000mg  of tylenol every 6 hours as needed for pain. Do not take more then 4g per day.   You may use up to 600mg  ibuprofen every 6 hours as needed for pain.  Do not exceed 2.4g of ibuprofen per day.  Return to the ER should you develop fever, chills, pus drainage from your wound, redness around your wound.

## 2023-06-16 ENCOUNTER — Other Ambulatory Visit (HOSPITAL_BASED_OUTPATIENT_CLINIC_OR_DEPARTMENT_OTHER): Payer: Self-pay

## 2023-06-17 ENCOUNTER — Other Ambulatory Visit (HOSPITAL_BASED_OUTPATIENT_CLINIC_OR_DEPARTMENT_OTHER): Payer: Self-pay

## 2023-06-17 MED ORDER — TRIAMTERENE-HCTZ 37.5-25 MG PO TABS
0.5000 | ORAL_TABLET | Freq: Every morning | ORAL | 1 refills | Status: AC
  Filled 2023-06-17: qty 30, 60d supply, fill #0
  Filled 2023-09-27: qty 30, 60d supply, fill #1

## 2023-06-17 MED ORDER — VITAMIN D (ERGOCALCIFEROL) 1.25 MG (50000 UNIT) PO CAPS
50000.0000 [IU] | ORAL_CAPSULE | ORAL | 3 refills | Status: AC
  Filled 2023-09-27: qty 12, 84d supply, fill #0

## 2023-06-27 ENCOUNTER — Other Ambulatory Visit (HOSPITAL_BASED_OUTPATIENT_CLINIC_OR_DEPARTMENT_OTHER): Payer: Self-pay

## 2023-06-27 DIAGNOSIS — F419 Anxiety disorder, unspecified: Secondary | ICD-10-CM | POA: Diagnosis not present

## 2023-06-27 DIAGNOSIS — I10 Essential (primary) hypertension: Secondary | ICD-10-CM | POA: Diagnosis not present

## 2023-06-27 MED ORDER — LOSARTAN POTASSIUM 100 MG PO TABS
100.0000 mg | ORAL_TABLET | Freq: Every day | ORAL | 0 refills | Status: DC
  Filled 2023-06-27: qty 90, 90d supply, fill #0

## 2023-06-27 MED ORDER — VENLAFAXINE HCL ER 75 MG PO CP24
75.0000 mg | ORAL_CAPSULE | Freq: Every day | ORAL | 1 refills | Status: AC
  Filled 2023-06-27: qty 90, 90d supply, fill #0
  Filled 2023-09-27: qty 90, 90d supply, fill #1

## 2023-09-27 ENCOUNTER — Other Ambulatory Visit (HOSPITAL_BASED_OUTPATIENT_CLINIC_OR_DEPARTMENT_OTHER): Payer: Self-pay

## 2023-09-28 ENCOUNTER — Other Ambulatory Visit (HOSPITAL_BASED_OUTPATIENT_CLINIC_OR_DEPARTMENT_OTHER): Payer: Self-pay

## 2023-09-28 MED ORDER — LOSARTAN POTASSIUM 100 MG PO TABS
100.0000 mg | ORAL_TABLET | Freq: Every day | ORAL | 3 refills | Status: AC
  Filled 2023-09-28: qty 90, 90d supply, fill #0
  Filled 2023-12-27: qty 90, 90d supply, fill #1
  Filled 2024-03-25: qty 90, 90d supply, fill #2

## 2023-12-26 ENCOUNTER — Other Ambulatory Visit (HOSPITAL_BASED_OUTPATIENT_CLINIC_OR_DEPARTMENT_OTHER): Payer: Self-pay

## 2023-12-26 MED ORDER — VENLAFAXINE HCL ER 150 MG PO CP24
150.0000 mg | ORAL_CAPSULE | Freq: Every day | ORAL | 3 refills | Status: AC
  Filled 2023-12-26: qty 90, 90d supply, fill #0
  Filled 2024-03-25: qty 90, 90d supply, fill #1

## 2023-12-27 ENCOUNTER — Other Ambulatory Visit (HOSPITAL_BASED_OUTPATIENT_CLINIC_OR_DEPARTMENT_OTHER): Payer: Self-pay

## 2024-03-26 ENCOUNTER — Other Ambulatory Visit (HOSPITAL_BASED_OUTPATIENT_CLINIC_OR_DEPARTMENT_OTHER): Payer: Self-pay
